# Patient Record
Sex: Male | Born: 1935 | Race: White | Hispanic: No | Smoking: Former smoker
Health system: Southern US, Community
[De-identification: ages and names within clinical notes are randomized; demographics above are authoritative.]

## PROBLEM LIST (undated history)

## (undated) DIAGNOSIS — I2510 Atherosclerotic heart disease of native coronary artery without angina pectoris: Secondary | ICD-10-CM

## (undated) DIAGNOSIS — E7800 Pure hypercholesterolemia, unspecified: Secondary | ICD-10-CM

## (undated) DIAGNOSIS — I10 Essential (primary) hypertension: Secondary | ICD-10-CM

## (undated) DIAGNOSIS — E119 Type 2 diabetes mellitus without complications: Secondary | ICD-10-CM

## (undated) DIAGNOSIS — I639 Cerebral infarction, unspecified: Secondary | ICD-10-CM

## (undated) DIAGNOSIS — G459 Transient cerebral ischemic attack, unspecified: Secondary | ICD-10-CM

## (undated) DIAGNOSIS — I219 Acute myocardial infarction, unspecified: Secondary | ICD-10-CM

## (undated) HISTORY — PX: CAROTID ENDARTERECTOMY: SUR193

## (undated) HISTORY — PX: HX TONSILLECTOMY: SHX27

## (undated) HISTORY — PX: HX WRIST FRACTURE TX: SHX121

## (undated) HISTORY — DX: Atherosclerotic heart disease of native coronary artery without angina pectoris: I25.10

## (undated) HISTORY — PX: HX HEART SURGERY: 2100001149

## (undated) HISTORY — DX: Cerebral infarction, unspecified (CMS HCC): I63.9

## (undated) HISTORY — PX: HX CAROTID ENDARTERECTOMY: SHX38

## (undated) HISTORY — DX: Acute myocardial infarction, unspecified (CMS HCC): I21.9

## (undated) HISTORY — PX: HX CORONARY ARTERY BYPASS GRAFT: SHX141

## (undated) HISTORY — PX: HX APPENDECTOMY: SHX54

## (undated) HISTORY — PX: HX ANKLE FRACTURE TX: SHX122

---

## 1958-12-02 HISTORY — PX: LUMBAR DISC SURGERY: SHX700

## 1982-12-02 HISTORY — PX: WRIST SURGERY: SHX841

## 1994-09-19 ENCOUNTER — Ambulatory Visit (HOSPITAL_COMMUNITY): Payer: Self-pay

## 1994-12-02 HISTORY — PX: ANKLE SURGERY: SHX546

## 2005-11-25 ENCOUNTER — Inpatient Hospital Stay (HOSPITAL_COMMUNITY): Admission: EM | Admit: 2005-11-25 | Discharge: 2005-11-28 | Payer: Self-pay | Admitting: Emergency Medicine

## 2005-11-28 ENCOUNTER — Inpatient Hospital Stay: Admission: RE | Admit: 2005-11-28 | Discharge: 2005-12-04 | Payer: Self-pay | Admitting: Internal Medicine

## 2012-05-02 HISTORY — PX: CORONARY ARTERY BYPASS GRAFT: SHX141

## 2012-05-05 ENCOUNTER — Emergency Department (EMERGENCY_DEPARTMENT_HOSPITAL): Payer: Medicare Other

## 2012-05-05 ENCOUNTER — Emergency Department (EMERGENCY_DEPARTMENT_HOSPITAL)
Admission: EM | Admit: 2012-05-05 | Discharge: 2012-05-05 | Payer: Medicare Other | Attending: EMERGENCY MEDICINE | Admitting: EMERGENCY MEDICINE

## 2012-07-15 ENCOUNTER — Other Ambulatory Visit: Payer: Self-pay | Admitting: Family Medicine

## 2012-07-15 DIAGNOSIS — I679 Cerebrovascular disease, unspecified: Secondary | ICD-10-CM

## 2012-07-16 ENCOUNTER — Other Ambulatory Visit: Payer: Self-pay

## 2012-07-18 ENCOUNTER — Ambulatory Visit
Admission: RE | Admit: 2012-07-18 | Discharge: 2012-07-18 | Disposition: A | Discharge status: Home / Self Care | Payer: Medicare PPO | Source: Ambulatory Visit | Attending: Family Medicine | Admitting: Family Medicine

## 2012-07-18 DIAGNOSIS — I679 Cerebrovascular disease, unspecified: Secondary | ICD-10-CM

## 2012-07-22 ENCOUNTER — Ambulatory Visit: Payer: Medicare PPO | Admitting: *Deleted

## 2012-07-22 ENCOUNTER — Ambulatory Visit: Payer: Medicare PPO | Attending: Family Medicine | Admitting: Physical Therapy

## 2012-07-22 DIAGNOSIS — Z5189 Encounter for other specified aftercare: Secondary | ICD-10-CM | POA: Insufficient documentation

## 2012-07-22 DIAGNOSIS — I69919 Unspecified symptoms and signs involving cognitive functions following unspecified cerebrovascular disease: Secondary | ICD-10-CM | POA: Insufficient documentation

## 2012-07-22 DIAGNOSIS — R5381 Other malaise: Secondary | ICD-10-CM | POA: Insufficient documentation

## 2012-07-22 DIAGNOSIS — M2560 Stiffness of unspecified joint, not elsewhere classified: Secondary | ICD-10-CM | POA: Insufficient documentation

## 2012-07-22 DIAGNOSIS — M6281 Muscle weakness (generalized): Secondary | ICD-10-CM | POA: Insufficient documentation

## 2012-07-22 DIAGNOSIS — R269 Unspecified abnormalities of gait and mobility: Secondary | ICD-10-CM | POA: Insufficient documentation

## 2012-07-22 DIAGNOSIS — I69998 Other sequelae following unspecified cerebrovascular disease: Secondary | ICD-10-CM | POA: Insufficient documentation

## 2012-07-22 DIAGNOSIS — R279 Unspecified lack of coordination: Secondary | ICD-10-CM | POA: Insufficient documentation

## 2012-07-27 ENCOUNTER — Ambulatory Visit: Payer: Medicare PPO

## 2012-07-29 ENCOUNTER — Ambulatory Visit: Payer: Medicare PPO | Admitting: Occupational Therapy

## 2012-07-29 ENCOUNTER — Ambulatory Visit: Payer: Medicare PPO | Admitting: Physical Therapy

## 2012-07-30 ENCOUNTER — Ambulatory Visit: Payer: Medicare PPO | Admitting: Occupational Therapy

## 2012-07-31 ENCOUNTER — Ambulatory Visit: Payer: Medicare PPO | Admitting: Rehabilitative and Restorative Service Providers"

## 2012-08-04 ENCOUNTER — Ambulatory Visit: Payer: Medicare PPO | Attending: Family Medicine | Admitting: Rehabilitative and Restorative Service Providers"

## 2012-08-04 DIAGNOSIS — M2560 Stiffness of unspecified joint, not elsewhere classified: Secondary | ICD-10-CM | POA: Insufficient documentation

## 2012-08-04 DIAGNOSIS — I69998 Other sequelae following unspecified cerebrovascular disease: Secondary | ICD-10-CM | POA: Insufficient documentation

## 2012-08-04 DIAGNOSIS — R5381 Other malaise: Secondary | ICD-10-CM | POA: Insufficient documentation

## 2012-08-04 DIAGNOSIS — M6281 Muscle weakness (generalized): Secondary | ICD-10-CM | POA: Insufficient documentation

## 2012-08-04 DIAGNOSIS — I69919 Unspecified symptoms and signs involving cognitive functions following unspecified cerebrovascular disease: Secondary | ICD-10-CM | POA: Insufficient documentation

## 2012-08-04 DIAGNOSIS — R269 Unspecified abnormalities of gait and mobility: Secondary | ICD-10-CM | POA: Insufficient documentation

## 2012-08-04 DIAGNOSIS — Z5189 Encounter for other specified aftercare: Secondary | ICD-10-CM | POA: Insufficient documentation

## 2012-08-04 DIAGNOSIS — R279 Unspecified lack of coordination: Secondary | ICD-10-CM | POA: Insufficient documentation

## 2012-08-05 ENCOUNTER — Ambulatory Visit: Payer: Medicare PPO | Admitting: *Deleted

## 2012-08-05 ENCOUNTER — Ambulatory Visit: Payer: Medicare PPO | Admitting: Occupational Therapy

## 2012-08-07 ENCOUNTER — Ambulatory Visit: Payer: Medicare PPO

## 2012-08-07 ENCOUNTER — Ambulatory Visit: Payer: Medicare PPO | Admitting: Physical Therapy

## 2012-08-11 ENCOUNTER — Ambulatory Visit: Payer: Medicare PPO | Admitting: Occupational Therapy

## 2012-08-11 ENCOUNTER — Ambulatory Visit: Payer: Medicare PPO

## 2012-08-11 ENCOUNTER — Ambulatory Visit: Payer: Medicare PPO | Admitting: *Deleted

## 2012-08-14 ENCOUNTER — Ambulatory Visit: Payer: Medicare PPO | Admitting: Physical Therapy

## 2012-08-14 ENCOUNTER — Ambulatory Visit: Payer: Medicare PPO | Admitting: *Deleted

## 2012-08-14 ENCOUNTER — Ambulatory Visit: Payer: Medicare PPO

## 2012-08-18 ENCOUNTER — Ambulatory Visit: Payer: Medicare PPO | Admitting: Speech Pathology

## 2012-08-18 ENCOUNTER — Ambulatory Visit: Payer: Medicare PPO | Admitting: Occupational Therapy

## 2012-08-18 ENCOUNTER — Ambulatory Visit: Payer: Medicare PPO | Admitting: Physical Therapy

## 2012-08-20 ENCOUNTER — Ambulatory Visit: Payer: Medicare PPO | Admitting: Physical Therapy

## 2012-08-20 ENCOUNTER — Ambulatory Visit: Payer: Medicare PPO | Admitting: Occupational Therapy

## 2012-08-20 ENCOUNTER — Ambulatory Visit: Payer: Medicare PPO | Admitting: *Deleted

## 2012-08-26 ENCOUNTER — Ambulatory Visit: Payer: Medicare PPO | Admitting: Occupational Therapy

## 2012-08-26 ENCOUNTER — Ambulatory Visit: Payer: Medicare PPO

## 2012-08-26 ENCOUNTER — Ambulatory Visit: Payer: Medicare PPO | Admitting: *Deleted

## 2012-08-27 ENCOUNTER — Ambulatory Visit: Payer: Medicare PPO | Admitting: Physical Therapy

## 2012-08-27 ENCOUNTER — Ambulatory Visit: Payer: Medicare PPO | Admitting: Occupational Therapy

## 2012-08-27 ENCOUNTER — Ambulatory Visit: Payer: Medicare PPO

## 2012-08-28 ENCOUNTER — Encounter: Payer: Medicare PPO | Admitting: *Deleted

## 2012-08-31 ENCOUNTER — Encounter: Payer: Medicare PPO | Admitting: Occupational Therapy

## 2012-08-31 ENCOUNTER — Ambulatory Visit: Payer: Medicare PPO | Admitting: Physical Therapy

## 2012-08-31 ENCOUNTER — Ambulatory Visit: Payer: Medicare PPO | Admitting: Speech Pathology

## 2012-09-03 ENCOUNTER — Ambulatory Visit: Payer: Medicare PPO

## 2012-09-03 ENCOUNTER — Ambulatory Visit: Payer: Medicare PPO | Attending: Family Medicine | Admitting: Physical Therapy

## 2012-09-03 ENCOUNTER — Ambulatory Visit: Payer: Medicare PPO | Admitting: Occupational Therapy

## 2012-09-03 DIAGNOSIS — M2560 Stiffness of unspecified joint, not elsewhere classified: Secondary | ICD-10-CM | POA: Insufficient documentation

## 2012-09-03 DIAGNOSIS — I69998 Other sequelae following unspecified cerebrovascular disease: Secondary | ICD-10-CM | POA: Insufficient documentation

## 2012-09-03 DIAGNOSIS — Z5189 Encounter for other specified aftercare: Secondary | ICD-10-CM | POA: Insufficient documentation

## 2012-09-03 DIAGNOSIS — R279 Unspecified lack of coordination: Secondary | ICD-10-CM | POA: Insufficient documentation

## 2012-09-03 DIAGNOSIS — I69919 Unspecified symptoms and signs involving cognitive functions following unspecified cerebrovascular disease: Secondary | ICD-10-CM | POA: Insufficient documentation

## 2012-09-03 DIAGNOSIS — M6281 Muscle weakness (generalized): Secondary | ICD-10-CM | POA: Insufficient documentation

## 2012-09-03 DIAGNOSIS — R269 Unspecified abnormalities of gait and mobility: Secondary | ICD-10-CM | POA: Insufficient documentation

## 2012-09-03 DIAGNOSIS — R5381 Other malaise: Secondary | ICD-10-CM | POA: Insufficient documentation

## 2012-09-04 ENCOUNTER — Ambulatory Visit: Payer: Medicare PPO | Admitting: *Deleted

## 2012-09-07 ENCOUNTER — Ambulatory Visit: Payer: Medicare PPO | Admitting: Physical Therapy

## 2012-09-07 ENCOUNTER — Encounter: Payer: Medicare PPO | Admitting: Occupational Therapy

## 2012-09-08 ENCOUNTER — Ambulatory Visit: Payer: Medicare PPO

## 2012-09-08 ENCOUNTER — Ambulatory Visit: Payer: Medicare PPO | Admitting: Occupational Therapy

## 2012-09-08 ENCOUNTER — Encounter: Payer: Medicare PPO | Admitting: Occupational Therapy

## 2012-09-08 ENCOUNTER — Ambulatory Visit: Payer: Medicare PPO | Admitting: Physical Therapy

## 2012-09-11 ENCOUNTER — Ambulatory Visit: Payer: Medicare PPO | Admitting: *Deleted

## 2012-09-11 ENCOUNTER — Ambulatory Visit: Payer: Medicare PPO | Admitting: Occupational Therapy

## 2012-09-11 ENCOUNTER — Ambulatory Visit: Payer: Medicare PPO

## 2012-09-12 ENCOUNTER — Emergency Department (HOSPITAL_COMMUNITY): Payer: Medicare PPO

## 2012-09-12 ENCOUNTER — Encounter (HOSPITAL_COMMUNITY): Payer: Self-pay | Admitting: Family Medicine

## 2012-09-12 ENCOUNTER — Inpatient Hospital Stay (HOSPITAL_COMMUNITY)
Admission: EM | Admit: 2012-09-12 | Discharge: 2012-09-14 | DRG: 312 | Disposition: A | Discharge status: Home / Self Care | Payer: Medicare PPO | Attending: Internal Medicine | Admitting: Internal Medicine

## 2012-09-12 DIAGNOSIS — I639 Cerebral infarction, unspecified: Secondary | ICD-10-CM

## 2012-09-12 DIAGNOSIS — Z87891 Personal history of nicotine dependence: Secondary | ICD-10-CM

## 2012-09-12 DIAGNOSIS — E860 Dehydration: Secondary | ICD-10-CM | POA: Diagnosis present

## 2012-09-12 DIAGNOSIS — D649 Anemia, unspecified: Secondary | ICD-10-CM | POA: Diagnosis present

## 2012-09-12 DIAGNOSIS — E1169 Type 2 diabetes mellitus with other specified complication: Secondary | ICD-10-CM | POA: Diagnosis present

## 2012-09-12 DIAGNOSIS — E119 Type 2 diabetes mellitus without complications: Secondary | ICD-10-CM | POA: Diagnosis present

## 2012-09-12 DIAGNOSIS — E7800 Pure hypercholesterolemia, unspecified: Secondary | ICD-10-CM | POA: Diagnosis present

## 2012-09-12 DIAGNOSIS — R55 Syncope and collapse: Principal | ICD-10-CM | POA: Diagnosis present

## 2012-09-12 DIAGNOSIS — Z79899 Other long term (current) drug therapy: Secondary | ICD-10-CM

## 2012-09-12 DIAGNOSIS — Z8673 Personal history of transient ischemic attack (TIA), and cerebral infarction without residual deficits: Secondary | ICD-10-CM

## 2012-09-12 DIAGNOSIS — I059 Rheumatic mitral valve disease, unspecified: Secondary | ICD-10-CM | POA: Diagnosis present

## 2012-09-12 DIAGNOSIS — I10 Essential (primary) hypertension: Secondary | ICD-10-CM | POA: Diagnosis present

## 2012-09-12 DIAGNOSIS — E876 Hypokalemia: Secondary | ICD-10-CM | POA: Diagnosis not present

## 2012-09-12 DIAGNOSIS — I672 Cerebral atherosclerosis: Secondary | ICD-10-CM | POA: Diagnosis present

## 2012-09-12 DIAGNOSIS — I6350 Cerebral infarction due to unspecified occlusion or stenosis of unspecified cerebral artery: Secondary | ICD-10-CM

## 2012-09-12 DIAGNOSIS — Z794 Long term (current) use of insulin: Secondary | ICD-10-CM

## 2012-09-12 DIAGNOSIS — Z951 Presence of aortocoronary bypass graft: Secondary | ICD-10-CM

## 2012-09-12 DIAGNOSIS — I2510 Atherosclerotic heart disease of native coronary artery without angina pectoris: Secondary | ICD-10-CM | POA: Diagnosis present

## 2012-09-12 DIAGNOSIS — I6789 Other cerebrovascular disease: Secondary | ICD-10-CM

## 2012-09-12 DIAGNOSIS — Z7982 Long term (current) use of aspirin: Secondary | ICD-10-CM

## 2012-09-12 DIAGNOSIS — I511 Rupture of chordae tendineae, not elsewhere classified: Secondary | ICD-10-CM

## 2012-09-12 DIAGNOSIS — I671 Cerebral aneurysm, nonruptured: Secondary | ICD-10-CM | POA: Diagnosis present

## 2012-09-12 DIAGNOSIS — I340 Nonrheumatic mitral (valve) insufficiency: Secondary | ICD-10-CM

## 2012-09-12 HISTORY — DX: Atherosclerotic heart disease of native coronary artery without angina pectoris: I25.10

## 2012-09-12 HISTORY — DX: Type 2 diabetes mellitus without complications: E11.9

## 2012-09-12 HISTORY — DX: Transient cerebral ischemic attack, unspecified: G45.9

## 2012-09-12 HISTORY — DX: Cerebral infarction, unspecified: I63.9

## 2012-09-12 HISTORY — DX: Pure hypercholesterolemia, unspecified: E78.00

## 2012-09-12 HISTORY — DX: Essential (primary) hypertension: I10

## 2012-09-12 LAB — CBC
HCT: 33.6 % — ABNORMAL LOW (ref 39.0–52.0)
MCH: 31.8 pg (ref 26.0–34.0)
MCV: 97.1 fL (ref 78.0–100.0)
Platelets: 248 10*3/uL (ref 150–400)
RDW: 13.8 % (ref 11.5–15.5)
WBC: 9 10*3/uL (ref 4.0–10.5)

## 2012-09-12 LAB — BASIC METABOLIC PANEL
CO2: 28 mEq/L (ref 19–32)
Chloride: 107 mEq/L (ref 96–112)
Creatinine, Ser: 1.28 mg/dL (ref 0.50–1.35)
GFR calc Af Amer: 57 mL/min — ABNORMAL LOW (ref >90)
Potassium: 5.4 mEq/L — ABNORMAL HIGH (ref 3.5–5.1)
Sodium: 140 mEq/L (ref 135–145)

## 2012-09-12 LAB — CBC WITH DIFFERENTIAL/PLATELET
Basophils Absolute: 0.1 10*3/uL (ref 0.0–0.1)
Eosinophils Relative: 5 % (ref 0–5)
HCT: 34.2 % — ABNORMAL LOW (ref 39.0–52.0)
Hemoglobin: 11.1 g/dL — ABNORMAL LOW (ref 13.0–17.0)
Lymphocytes Relative: 18 % (ref 12–46)
Lymphs Abs: 1.3 10*3/uL (ref 0.7–4.0)
MCV: 96.3 fL (ref 78.0–100.0)
Monocytes Absolute: 0.4 10*3/uL (ref 0.1–1.0)
Neutro Abs: 5 10*3/uL (ref 1.7–7.7)
RBC: 3.55 MIL/uL — ABNORMAL LOW (ref 4.22–5.81)
RDW: 13.6 % (ref 11.5–15.5)
WBC: 7.2 10*3/uL (ref 4.0–10.5)

## 2012-09-12 LAB — URINALYSIS, ROUTINE W REFLEX MICROSCOPIC
Hgb urine dipstick: NEGATIVE
Nitrite: NEGATIVE
Specific Gravity, Urine: 1.016 (ref 1.005–1.030)
Urobilinogen, UA: 0.2 mg/dL (ref 0.0–1.0)
pH: 7.5 (ref 5.0–8.0)

## 2012-09-12 LAB — GLUCOSE, CAPILLARY
Glucose-Capillary: 142 mg/dL — ABNORMAL HIGH (ref 70–99)
Glucose-Capillary: 226 mg/dL — ABNORMAL HIGH (ref 70–99)
Glucose-Capillary: 54 mg/dL — ABNORMAL LOW (ref 70–99)

## 2012-09-12 LAB — CREATININE, SERUM: GFR calc Af Amer: 53 mL/min — ABNORMAL LOW (ref >90)

## 2012-09-12 MED ORDER — NITROGLYCERIN 0.4 MG SL SUBL: 0.4000 mg | SUBLINGUAL_TABLET | SUBLINGUAL | Status: DC | PRN

## 2012-09-12 MED ORDER — ACETAMINOPHEN 325 MG PO TABS
650.0000 mg | ORAL_TABLET | ORAL | Status: DC | PRN
  Administered 2012-09-13 (×2): 650 mg via ORAL
  Filled 2012-09-12 (×2): qty 2

## 2012-09-12 MED ORDER — SODIUM CHLORIDE 0.9 % IV SOLN: INTRAVENOUS | Status: DC

## 2012-09-12 MED ORDER — INSULIN ASPART 100 UNIT/ML ~~LOC~~ SOLN: 0.0000 [IU] | Freq: Every day | SUBCUTANEOUS | Status: DC

## 2012-09-12 MED ORDER — POLYETHYLENE GLYCOL 3350 17 G PO PACK
17.0000 g | PACK | Freq: Every day | ORAL | Status: DC | PRN
  Filled 2012-09-12: qty 1

## 2012-09-12 MED ORDER — ASPIRIN EC 81 MG PO TBEC
81.0000 mg | DELAYED_RELEASE_TABLET | Freq: Every day | ORAL | Status: DC
  Administered 2012-09-13: 81 mg via ORAL
  Filled 2012-09-12: qty 1

## 2012-09-12 MED ORDER — IRBESARTAN 75 MG PO TABS
75.0000 mg | ORAL_TABLET | Freq: Every day | ORAL | Status: DC
  Administered 2012-09-13: 75 mg via ORAL
  Filled 2012-09-12 (×3): qty 1

## 2012-09-12 MED ORDER — DOCUSATE SODIUM 100 MG PO CAPS
100.0000 mg | ORAL_CAPSULE | Freq: Two times a day (BID) | ORAL | Status: DC
  Administered 2012-09-12 – 2012-09-14 (×4): 100 mg via ORAL
  Filled 2012-09-12 (×5): qty 1

## 2012-09-12 MED ORDER — METOPROLOL SUCCINATE ER 50 MG PO TB24
50.0000 mg | ORAL_TABLET | Freq: Every day | ORAL | Status: DC
  Administered 2012-09-13 – 2012-09-14 (×2): 50 mg via ORAL
  Filled 2012-09-12 (×2): qty 1

## 2012-09-12 MED ORDER — FERROUS SULFATE 325 (65 FE) MG PO TABS
325.0000 mg | ORAL_TABLET | Freq: Every day | ORAL | Status: DC
  Administered 2012-09-13 – 2012-09-14 (×2): 325 mg via ORAL
  Filled 2012-09-12 (×3): qty 1

## 2012-09-12 MED ORDER — ASPIRIN 81 MG PO TABS: 81.0000 mg | ORAL_TABLET | Freq: Every day | ORAL | Status: DC

## 2012-09-12 MED ORDER — SODIUM CHLORIDE 0.9 % IJ SOLN: 3.0000 mL | INTRAMUSCULAR | Status: DC | PRN

## 2012-09-12 MED ORDER — INSULIN ASPART 100 UNIT/ML ~~LOC~~ SOLN
0.0000 [IU] | Freq: Three times a day (TID) | SUBCUTANEOUS | Status: DC
  Administered 2012-09-12: 3 [IU] via SUBCUTANEOUS

## 2012-09-12 MED ORDER — INSULIN ASPART 100 UNIT/ML ~~LOC~~ SOLN
3.0000 [IU] | Freq: Three times a day (TID) | SUBCUTANEOUS | Status: DC
  Administered 2012-09-12: 3 [IU] via SUBCUTANEOUS

## 2012-09-12 MED ORDER — ONDANSETRON HCL 4 MG/2ML IJ SOLN: 4.0000 mg | Freq: Four times a day (QID) | INTRAMUSCULAR | Status: DC | PRN

## 2012-09-12 MED ORDER — SODIUM CHLORIDE 0.9 % IJ SOLN: 3.0000 mL | Freq: Two times a day (BID) | INTRAMUSCULAR | Status: DC

## 2012-09-12 MED ORDER — SODIUM CHLORIDE 0.9 % IV SOLN: 250.0000 mL | INTRAVENOUS | Status: DC | PRN

## 2012-09-12 MED ORDER — ONDANSETRON HCL 4 MG PO TABS: 4.0000 mg | ORAL_TABLET | Freq: Four times a day (QID) | ORAL | Status: DC | PRN

## 2012-09-12 MED ORDER — DOCUSATE SODIUM 100 MG PO CAPS: 100.0000 mg | ORAL_CAPSULE | Freq: Two times a day (BID) | ORAL | Status: DC

## 2012-09-12 MED ORDER — HEPARIN SODIUM (PORCINE) 5000 UNIT/ML IJ SOLN
5000.0000 [IU] | Freq: Three times a day (TID) | INTRAMUSCULAR | Status: DC
  Administered 2012-09-12 – 2012-09-14 (×4): 5000 [IU] via SUBCUTANEOUS
  Filled 2012-09-12 (×8): qty 1

## 2012-09-12 MED ORDER — INSULIN GLARGINE 100 UNIT/ML ~~LOC~~ SOLN: 10.0000 [IU] | Freq: Every day | SUBCUTANEOUS | Status: DC

## 2012-09-12 MED ORDER — ACETAMINOPHEN ER 650 MG PO TBCR: 650.0000 mg | EXTENDED_RELEASE_TABLET | ORAL | Status: DC | PRN

## 2012-09-12 MED ORDER — HEPARIN SODIUM (PORCINE) 5000 UNIT/ML IJ SOLN: 5000.0000 [IU] | Freq: Three times a day (TID) | INTRAMUSCULAR | Status: DC

## 2012-09-12 MED ORDER — SODIUM CHLORIDE 0.9 % IJ SOLN
3.0000 mL | Freq: Two times a day (BID) | INTRAMUSCULAR | Status: DC
  Administered 2012-09-13: 3 mL via INTRAVENOUS

## 2012-09-12 MED ORDER — SENNA 8.6 MG PO TABS
1.0000 | ORAL_TABLET | Freq: Two times a day (BID) | ORAL | Status: DC
  Administered 2012-09-12 – 2012-09-14 (×4): 8.6 mg via ORAL
  Filled 2012-09-12 (×5): qty 1

## 2012-09-12 MED ORDER — ADULT MULTIVITAMIN W/MINERALS CH
1.0000 | ORAL_TABLET | Freq: Every day | ORAL | Status: DC
  Administered 2012-09-13 – 2012-09-14 (×2): 1 via ORAL
  Filled 2012-09-12 (×2): qty 1

## 2012-09-12 MED ORDER — ATORVASTATIN CALCIUM 40 MG PO TABS
40.0000 mg | ORAL_TABLET | Freq: Every day | ORAL | Status: DC
  Administered 2012-09-13: 40 mg via ORAL
  Filled 2012-09-12 (×2): qty 1

## 2012-09-12 NOTE — H&P (Signed)
[facility] History and Physical  DETERRIUS May AVW:[id number] DOB: [birth year] DOA: 09/12/2012  Referring physician: Dr. Verne Spurr PCP: Benita Stabile, MD     Chief Complaint: Syncope  HPI: Douglas May is a 78 y.o. male  There is an 78 year old male with past medical history of diabetes type 2, history of CVA in June 2013, hypertension that comes in as he passed out this morning. As per daughter which is at bedside and giving the history he came down from his room, started to make his breakfast when he said he didn't feel well and sat down. Lost consciousness for about 10 minutes and 6 EMS got there he was awake and making jokes. She did not see any seizure-like activity. The patient denies any chest pain shortness of breath palpitations. He just felt tired before this episode. His blood glucose was checked in the morning before he passed out by his daughter and it was 22 .He relates no prodromal symptoms. He's had no fever chills nausea vomiting or diarrhea.  Review of Systems: The patient denies anorexia, fever, weight loss,, vision loss, decreased hearing, hoarseness, chest pain, syncope, dyspnea on exertion, peripheral edema, balance deficits, hemoptysis, abdominal pain, melena, hematochezia, severe indigestion/heartburn, hematuria, incontinence, genital sores, muscle weakness, suspicious skin lesions, transient blindness, difficulty walking, depression, unusual weight change, abnormal bleeding, enlarged lymph nodes, angioedema, and breast masses.   Past Medical History  Diagnosis Date  . Stroke   . Diabetes mellitus without complication   . CAD (coronary artery disease)   . Hypertension   . High cholesterol   . TIA (transient ischemic attack)    Past Surgical History  Procedure Date  . Carotid endarterectomy   . Coronary artery bypass graft    Social History:  reports that he has quit smoking. His smoking use included Cigarettes. He smoked 1 pack per day. He does  not have any smokeless tobacco history on file. He reports that he does not drink alcohol or use illicit drugs. Lives with daughter, currently getting outpatient physical therapy. Performed all his ADLs Allergies  Allergen Reactions  . Morphine And Related Itching  . Penicillins Other (See Comments)    unknown    No family history on file. parents died unknown cause (be sure to complete)  Prior to Admission medications   Medication Sig Start Date End Date Taking? Authorizing Provider  acetaminophen (TYLENOL) 650 MG CR tablet Take 650 mg by mouth every 4 (four) hours as needed. For pain   Yes Historical Provider, MD  aspirin 81 MG tablet Take 81 mg by mouth daily.   Yes Historical Provider, MD  atorvastatin (LIPITOR) 40 MG tablet Take 40 mg by mouth daily.   Yes Historical Provider, MD  CALCIUM-VITAMIN D PO Take 1 tablet by mouth daily.   Yes Historical Provider, MD  docusate sodium (COLACE) 100 MG capsule Take 100 mg by mouth 2 (two) times daily.   Yes Historical Provider, MD  ferrous sulfate 325 (65 FE) MG tablet Take 325 mg by mouth daily with breakfast.   Yes Historical Provider, MD  insulin glargine (LANTUS) 100 UNIT/ML injection Inject 10 Units into the skin at bedtime.   Yes Historical Provider, MD  irbesartan (AVAPRO) 75 MG tablet Take 75 mg by mouth at bedtime.   Yes Historical Provider, MD  metoprolol succinate (TOPROL-XL) 50 MG 24 hr tablet Take 50 mg by mouth daily. Take with or immediately following a meal.   Yes Historical Provider, MD  Multiple Vitamin (MULTIVITAMIN WITH  MINERALS) TABS Take 1 tablet by mouth daily.   Yes Historical Provider, MD  Multiple Vitamins-Minerals (PRESERVISION AREDS PO) Take 1 capsule by mouth 2 (two) times daily.   Yes Historical Provider, MD  naproxen (NAPROSYN) 500 MG tablet Take 500 mg by mouth daily as needed.   Yes Historical Provider, MD  nitroGLYCERIN (NITROSTAT) 0.4 MG SL tablet Place 0.4 mg under the tongue every 5 (five) minutes as needed.  Chest pain   Yes Historical Provider, MD  polyethylene glycol (MIRALAX / GLYCOLAX) packet Take 17 g by mouth daily as needed. constipation   Yes Historical Provider, MD   Physical Exam: Filed Vitals:   09/12/12 1247 09/12/12 1248 09/12/12 1250 09/12/12 1300  BP: 133/59 135/60 127/61 129/61  Pulse: 64 82 80 65  Temp:      TempSrc:      Resp:    18  Weight:      SpO2:    100%     General:  Awake alert and oriented x3 slightly dysarthric  Eyes: Anicteric pupils equally round and reactive trunk movements are intact  ENT: Moist mucous member, no bruit  Neck: No JVD  Cardiovascular: Regular rate and rhythm  Respiratory: Good air movement clear to auscultation  Abdomen: Positive bowel sounds nontender nondistended and soft  Skin: Multiple bruises  Musculoskeletal: Intact, except for deformed left shoulder  Psychiatric: Appropriate  Neurologic: Awake alert and oriented x3 coherent for language mildly dysarthric. Cranial nerves 3-12 are grossly intact sensation is intact throughout muscle strength is 5 over 5 on the left and about a 4/5 on the right from his old stroke. Reflexes are intact and finger-to-nose is normal  Labs on Admission:  Basic Metabolic Panel:  Lab 09/12/12 1610  NA 140  K 5.4*  CL 107  CO2 28  GLUCOSE 132*  BUN 26*  CREATININE 1.28  CALCIUM 9.8  MG --  PHOS --   Liver Function Tests: No results found for this basename: AST:5,ALT:5,ALKPHOS:5,BILITOT:5,PROT:5,ALBUMIN:5 in the last 168 hours No results found for this basename: LIPASE:5,AMYLASE:5 in the last 168 hours No results found for this basename: AMMONIA:5 in the last 168 hours CBC:  Lab 09/12/12 1054  WBC 7.2  NEUTROABS 5.0  HGB 11.1*  HCT 34.2*  MCV 96.3  PLT 253   Cardiac Enzymes:  Lab 09/12/12 1055  CKTOTAL --  CKMB --  CKMBINDEX --  TROPONINI <0.30    BNP (last 3 results) No results found for this basename: PROBNP:3 in the last 8760 hours CBG:  Lab 09/12/12 1021    GLUCAP 142*    Radiological Exams on Admission: Dg Chest 2 View  09/12/2012  *RADIOLOGY REPORT*  Clinical Data: Weakness and syncope  CHEST - 2 VIEW  Comparison: 11/25/2005  Findings: Postsurgical changes are again seen.  The heart and pulmonary vascularity are within normal limits.  A hiatal hernia is noted.  The lungs are clear bilaterally.  Chronic changes in the proximal left humerus are noted.  IMPRESSION: No acute abnormality is seen.   Original Report Authenticated By: Phillips Odor, M.D.    Ct Head Wo Contrast  09/12/2012  *RADIOLOGY REPORT*  Clinical Data: Sudden onset weakness and syncope, no trauma  CT HEAD WITHOUT CONTRAST  Technique:  Contiguous axial images were obtained from the base of the skull through the vertex without contrast.  Comparison: MR brain 07/18/2012  Findings: Calvarium is intact.  No hemorrhage, infarct, or mass. Diffuse cortical atrophy.  Severe periventricular white matter low attenuation consistent  with chronic small vessel ischemia with encephalomalacia left occipital lobe.  IMPRESSION: No acute findings.  Severe chronic age related involutional change.   Original Report Authenticated By: Otilio Carpen, M.D.     EKG: Independently reviewed. Shows normal sinus rhythm  Assessment/Plan Principal Problem: Syncope and collapse: -Carotid Doppler was done on 6 2013 this showed a 50% occlusion bilaterally on internal carotids. Will admit to telemetry, we'll cycle his cardiac enzymes. We'll get an MRI of the head to rule out a stroke. We'll place him n.p.o. until he passed a swallowing evaluation, then  Resumed a carb modified diet. We'll get a 2-D echo to rule out aortic stenosis. We'll get a physical therapy consult. Patient can get up with assistance. -Continue aspirin, statins and insulin.  HTN (hypertension): -Continue home meds currently well controlled.  Diabetes mellitus type 2, controlled -Continue current home dose of Lantus and sliding scale  insulin check a hemoglobin A1c check CBGs a.c. and at bedtime.   Hx of CABG/ CVA  05/2012 in [place] -Continue aspirin. -Beta blockers and statins.  Anemia: -Seems to be stable compared to previous. -Monitor.   Hypercholesteremia: -Continue statins.  Family Communication: Daughter Disposition Plan: Home (indicate anticipated LOS)  Time spent: 60 minutes  Marinda Elk [facility] Pager [phone]  If 7PM-7AM, please contact night-coverage [email or web address] Password [email or web address] 09/12/2012, 2:17 PM

## 2012-09-12 NOTE — ED Notes (Signed)
Per EMS, pt was sitting at kitchen table and had a syncopal episode. Pt lethargic upon arrival. CBG 132. BP 127/99. HR 62. Pt A&Ox3 currently. EKG nothing acute. Hx of CVA with right sided weakness and slurred speech.

## 2012-09-12 NOTE — ED Notes (Signed)
Patient transported to CT 

## 2012-09-12 NOTE — ED Provider Notes (Signed)
History     CSN: [id number]  Arrival date & time 09/12/12  1013   First MD Initiated Contact with Patient 09/12/12 1029      Chief Complaint  Patient presents with  . Loss of Consciousness     HPI Pt was seen at 1040.  Per pt and his family, c/o sudden onset and resolution of one episode of syncope that occurred PTA.  Pt was making breakfast, then walked over to a chair stating to his family he "didn't feel well."  This was followed by a syncopal episode that lasted approx 15 minutes per family witness.  Pt woke up and quickly returned to his baseline shortly after EMS arrival to scene.  Denies prodromal symptoms before syncope.  No seizure activity, no apnea, no incont of bowel or bladder, no AMS upon awakening. Denies CP/palpitations, no SOB/cough, no abd pain, no back pain, no N/V/D, no new focal motor weakness, no tingling/numbness in extremities.    Past Medical History  Diagnosis Date  . Stroke   . Diabetes mellitus without complication   . CAD (coronary artery disease)   . Hypertension   . High cholesterol   . TIA (transient ischemic attack)     Past Surgical History  Procedure Date  . Carotid endarterectomy   . Coronary artery bypass graft       History  Substance Use Topics  . Smoking status: Former smoker  . Smokeless tobacco: None  . Alcohol Use: none      Review of Systems ROS: Statement: All systems negative except as marked or noted in the HPI; Constitutional: Negative for fever and chills. ; ; Eyes: Negative for eye pain, redness and discharge. ; ; ENMT: Negative for ear pain, hoarseness, nasal congestion, sinus pressure and sore throat. ; ; Cardiovascular: Negative for chest pain, palpitations, diaphoresis, dyspnea and peripheral edema. ; ; Respiratory: Negative for cough, wheezing and stridor. ; ; Gastrointestinal: Negative for nausea, vomiting, diarrhea, abdominal pain, blood in stool, hematemesis, jaundice and rectal bleeding. . ; ; Genitourinary:  Negative for dysuria, flank pain and hematuria. ; ; Musculoskeletal: Negative for back pain and neck pain. Negative for swelling and trauma.; ; Skin: Negative for pruritus, rash, abrasions, blisters, bruising and skin lesion.; ; Neuro: Negative for headache, lightheadedness and neck stiffness. Negative for weakness, altered mental status, extremity weakness, paresthesias, involuntary movement, seizure and +syncope.       Allergies  Morphine and related and Penicillins  Home Medications   Current Outpatient Rx  Name Route Sig Dispense Refill  . ACETAMINOPHEN ER 650 MG PO TBCR Oral Take 650 mg by mouth every 4 (four) hours as needed. For pain    . ASPIRIN 81 MG PO TABS Oral Take 81 mg by mouth daily.    . ATORVASTATIN CALCIUM 40 MG PO TABS Oral Take 40 mg by mouth daily.    [name] CALCIUM-VITAMIN D PO Oral Take 1 tablet by mouth daily.    [name] DOCUSATE SODIUM 100 MG PO CAPS Oral Take 100 mg by mouth 2 (two) times daily.    [name] FERROUS SULFATE 325 (65 FE) MG PO TABS Oral Take 325 mg by mouth daily with breakfast.    . INSULIN GLARGINE 100 UNIT/ML [place] SOLN Subcutaneous Inject 10 Units into the skin at bedtime.    . IRBESARTAN 75 MG PO TABS Oral Take 75 mg by mouth at bedtime.    [name] METOPROLOL SUCCINATE ER 50 MG PO TB24 Oral Take 50 mg by mouth daily. Take  with or immediately following a meal.    . ADULT MULTIVITAMIN W/MINERALS CH Oral Take 1 tablet by mouth daily.    [name] PRESERVISION AREDS PO Oral Take 1 capsule by mouth 2 (two) times daily.    [name] NAPROXEN 500 MG PO TABS Oral Take 500 mg by mouth daily as needed.    [name] NITROGLYCERIN 0.4 MG SL SUBL Sublingual Place 0.4 mg under the tongue every 5 (five) minutes as needed. Chest pain    . POLYETHYLENE GLYCOL 3350 PO PACK Oral Take 17 g by mouth daily as needed. constipation      BP 129/61  Pulse 65  Temp 97.7 F (36.5 C) (Oral)  Resp 18  Wt 148 lb (67.132 kg)  SpO2 100%  Physical Exam 1045: Physical examination:  Nursing notes reviewed; Vital signs  and O2 SAT reviewed;  Constitutional: Well developed, Well nourished, In no acute distress; Head:  Normocephalic, atraumatic; Eyes: EOMI, PERRL, No scleral icterus, conjunctiva pale; ENMT: Mouth and pharynx normal, Mucous membranes dry; Neck: Supple, Full range of motion, No lymphadenopathy; Cardiovascular: Regular rate and rhythm, No gallop; Respiratory: Breath sounds clear & equal bilaterally, No wheezes.  Speaking full sentences with ease, Normal respiratory effort/excursion; Chest: Nontender, Movement normal; Abdomen: Soft, Nontender, Nondistended, Normal bowel sounds; Genitourinary: No CVA tenderness; Extremities: Pulses normal, No tenderness, No edema, No calf edema or asymmetry.; Neuro: AA&Ox3, Major CN grossly intact.  Speech slightly slurred per baseline per family at bedside. No facial droop.  Right grip weaker than left, RUE and RLE 4/5 strength compared to LUE and LLE, per hx of previous CVA.; Skin: Color pale, Warm, Dry.   ED Course  Procedures    MDM  MDM Reviewed: previous chart, nursing note and vitals Reviewed previous: ECG Interpretation: ECG, labs, x-ray and CT scan    Date: 09/12/2012  Rate: 60  Rhythm: normal sinus rhythm  QRS Axis: normal  Intervals: normal  ST/T Wave abnormalities: normal  Conduction Disutrbances:none  Narrative Interpretation:   Old EKG Reviewed: unchanged; no significant changes from previous EKG dated 11/25/2005.   Results for orders placed during the hospital encounter of 09/12/12  GLUCOSE, CAPILLARY      Component Value Range   Glucose-Capillary 142 (*) 70 - 99 mg/dL  BASIC METABOLIC PANEL      Component Value Range   Sodium 140  135 - 145 mEq/L   Potassium 5.4 (*) 3.5 - 5.1 mEq/L   Chloride 107  96 - 112 mEq/L   CO2 28  19 - 32 mEq/L   Glucose, Bld 132 (*) 70 - 99 mg/dL   BUN 26 (*) 6 - 23 mg/dL   Creatinine, Ser 7.82  0.50 - 1.35 mg/dL   Calcium 9.8  8.4 - 95.6 mg/dL   GFR calc non Af Amer 49 (*) >90 mL/min   GFR calc Af Amer 57  (*) >90 mL/min  CBC WITH DIFFERENTIAL      Component Value Range   WBC 7.2  4.0 - 10.5 K/uL   RBC 3.55 (*) 4.22 - 5.81 MIL/uL   Hemoglobin 11.1 (*) 13.0 - 17.0 g/dL   HCT 21.3 (*) 08.6 - 57.8 %   MCV 96.3  78.0 - 100.0 fL   MCH 31.3  26.0 - 34.0 pg   MCHC 32.5  30.0 - 36.0 g/dL   RDW 46.9  62.9 - 52.8 %   Platelets 253  150 - 400 K/uL   Neutrophils Relative 70  43 - 77 %   Neutro  Abs 5.0  1.7 - 7.7 K/uL   Lymphocytes Relative 18  12 - 46 %   Lymphs Abs 1.3  0.7 - 4.0 K/uL   Monocytes Relative 6  3 - 12 %   Monocytes Absolute 0.4  0.1 - 1.0 K/uL   Eosinophils Relative 5  0 - 5 %   Eosinophils Absolute 0.4  0.0 - 0.7 K/uL   Basophils Relative 1  0 - 1 %   Basophils Absolute 0.1  0.0 - 0.1 K/uL  TROPONIN I      Component Value Range   Troponin I <0.30  <0.30 ng/mL  URINALYSIS, ROUTINE W REFLEX MICROSCOPIC      Component Value Range   Color, Urine YELLOW  YELLOW   APPearance CLEAR  CLEAR   Specific Gravity, Urine 1.016  1.005 - 1.030   pH 7.5  5.0 - 8.0   Glucose, UA NEGATIVE  NEGATIVE mg/dL   Hgb urine dipstick NEGATIVE  NEGATIVE   Bilirubin Urine NEGATIVE  NEGATIVE   Ketones, ur NEGATIVE  NEGATIVE mg/dL   Protein, ur NEGATIVE  NEGATIVE mg/dL   Urobilinogen, UA 0.2  0.0 - 1.0 mg/dL   Nitrite NEGATIVE  NEGATIVE   Leukocytes, UA NEGATIVE  NEGATIVE   Dg Chest 2 View 09/12/2012  *RADIOLOGY REPORT*  Clinical Data: Weakness and syncope  CHEST - 2 VIEW  Comparison: 11/25/2005  Findings: Postsurgical changes are again seen.  The heart and pulmonary vascularity are within normal limits.  A hiatal hernia is noted.  The lungs are clear bilaterally.  Chronic changes in the proximal left humerus are noted.  IMPRESSION: No acute abnormality is seen.   Original Report Authenticated By: Phillips Odor, M.D.    Ct Head Wo Contrast 09/12/2012  *RADIOLOGY REPORT*  Clinical Data: Sudden onset weakness and syncope, no trauma  CT HEAD WITHOUT CONTRAST  Technique:  Contiguous axial images were  obtained from the base of the skull through the vertex without contrast.  Comparison: MR brain 07/18/2012  Findings: Calvarium is intact.  No hemorrhage, infarct, or mass. Diffuse cortical atrophy.  Severe periventricular white matter low attenuation consistent with chronic small vessel ischemia with encephalomalacia left occipital lobe.  IMPRESSION: No acute findings.  Severe chronic age related involutional change.   Original Report Authenticated By: Otilio Carpen, M.D.       1345:  No change in assessment.  Dx testing d/w pt and family.  Questions answered.  Verb understanding, agreeable to admit.  T/C to [facility]. David Stall, case discussed, including:  HPI, pertinent PM/SHx, VS/PE, dx testing, ED course and treatment:  Agreeable to admit, requests to write temporary orders, obtain tele bed to team 7.          Laray Anger, DO 09/12/12 2038

## 2012-09-13 ENCOUNTER — Inpatient Hospital Stay (HOSPITAL_COMMUNITY): Payer: Medicare PPO

## 2012-09-13 DIAGNOSIS — R55 Syncope and collapse: Secondary | ICD-10-CM

## 2012-09-13 DIAGNOSIS — D649 Anemia, unspecified: Secondary | ICD-10-CM

## 2012-09-13 DIAGNOSIS — I671 Cerebral aneurysm, nonruptured: Secondary | ICD-10-CM

## 2012-09-13 DIAGNOSIS — I6789 Other cerebrovascular disease: Secondary | ICD-10-CM

## 2012-09-13 DIAGNOSIS — I679 Cerebrovascular disease, unspecified: Secondary | ICD-10-CM

## 2012-09-13 LAB — URINE CULTURE

## 2012-09-13 LAB — COMPREHENSIVE METABOLIC PANEL
ALT: 10 U/L (ref 0–53)
AST: 15 U/L (ref 0–37)
Alkaline Phosphatase: 55 U/L (ref 39–117)
CO2: 27 mEq/L (ref 19–32)
Chloride: 108 mEq/L (ref 96–112)
Creatinine, Ser: 1.35 mg/dL (ref 0.50–1.35)
GFR calc non Af Amer: 46 mL/min — ABNORMAL LOW (ref >90)
Total Bilirubin: 0.3 mg/dL (ref 0.3–1.2)

## 2012-09-13 LAB — HEMOGLOBIN A1C
Hgb A1c MFr Bld: 6.1 % — ABNORMAL HIGH (ref <5.7)
Mean Plasma Glucose: 128 mg/dL — ABNORMAL HIGH (ref <117)

## 2012-09-13 LAB — RAPID URINE DRUG SCREEN, HOSP PERFORMED
Amphetamines: NOT DETECTED
Benzodiazepines: NOT DETECTED
Opiates: NOT DETECTED

## 2012-09-13 LAB — GLUCOSE, CAPILLARY
Glucose-Capillary: 130 mg/dL — ABNORMAL HIGH (ref 70–99)
Glucose-Capillary: 107 mg/dL — ABNORMAL HIGH (ref 70–99)
Glucose-Capillary: 140 mg/dL — ABNORMAL HIGH (ref 70–99)

## 2012-09-13 LAB — CBC
MCH: 31.2 pg (ref 26.0–34.0)
MCHC: 32.4 g/dL (ref 30.0–36.0)
Platelets: 221 10*3/uL (ref 150–400)
RBC: 3.33 MIL/uL — ABNORMAL LOW (ref 4.22–5.81)
RDW: 13.7 % (ref 11.5–15.5)

## 2012-09-13 LAB — LIPID PANEL
LDL Cholesterol: 40 mg/dL (ref 0–99)
Total CHOL/HDL Ratio: 2.4 RATIO
VLDL: 16 mg/dL (ref 0–40)

## 2012-09-13 LAB — TSH: TSH: 1.417 u[IU]/mL (ref 0.350–4.500)

## 2012-09-13 LAB — TROPONIN I: Troponin I: <0.3 ng/mL (ref <0.30)

## 2012-09-13 LAB — VITAMIN B12: Vitamin B-12: 373 pg/mL (ref 211–911)

## 2012-09-13 MED ORDER — ASPIRIN EC 325 MG PO TBEC
325.0000 mg | DELAYED_RELEASE_TABLET | Freq: Every day | ORAL | Status: DC
  Administered 2012-09-14: 325 mg via ORAL
  Filled 2012-09-13: qty 1

## 2012-09-13 MED ORDER — INSULIN GLARGINE 100 UNIT/ML ~~LOC~~ SOLN
5.0000 [IU] | Freq: Every day | SUBCUTANEOUS | Status: DC
  Administered 2012-09-13: 5 [IU] via SUBCUTANEOUS

## 2012-09-13 MED ORDER — SODIUM CHLORIDE 0.9 % IV SOLN
INTRAVENOUS | Status: DC
  Administered 2012-09-13 – 2012-09-14 (×2): via INTRAVENOUS

## 2012-09-13 NOTE — Progress Notes (Signed)
Patient oxygen saturations dropping into the mid 70's as patient is dozing of to sleep in the chair. Patient placed on 1 L N/C, 02 saturations increased to the high 90's. Dr. On call notified and made aware. Will continue to monitor patient.

## 2012-09-13 NOTE — Progress Notes (Signed)
VASCULAR LAB PRELIMINARY  PRELIMINARY  PRELIMINARY  PRELIMINARY  Carotid duplex completed.    Preliminary report:  Right:  No evidence of hemodynamically significant internal carotid artery stenosis.  Left:  40-59% internal carotid artery stenosis.  Bilateral vertebral artery flow is antegrade.  Bernadean Saling, RVS 09/13/2012, 4:21 PM

## 2012-09-13 NOTE — Progress Notes (Signed)
Physical Therapy Evaluation Patient Details Name: Douglas May MRN: [id number] DOB: 12-22-1923 Today's Date: 09/13/2012 Time: 1308-6578 PT Time Calculation (min): 37 min  PT Assessment / Plan / Recommendation Clinical Impression  Patient is an 78 yo male admitted following a syncopal episode.  Patient did well with mobility today.  He has 24 hour assist at home.  Recommend HHPT at discharge for continued therapy and home safety eval.  Patient will benefit from acute PT to maximize independence prior to discharge home with daughter.    PT Assessment  Patient needs continued PT services    Follow Up Recommendations  Home health PT;Supervision/Assistance - 24 hour    Does the patient have the potential to tolerate intense rehabilitation      Barriers to Discharge None      Equipment Recommendations  None recommended by PT    Recommendations for Other Services     Frequency Min 3X/week    Precautions / Restrictions Precautions Precautions: Fall Precaution Comments: Left shoulder injury/dislocated.  Don't pull on LUE. Restrictions Weight Bearing Restrictions: No   Pertinent Vitals/Pain       Mobility  Bed Mobility Bed Mobility: Supine to Sit;Sitting - Scoot to Edge of Bed Supine to Sit: 4: Min guard;With rails;HOB flat Sitting - Scoot to Edge of Bed: 4: Min guard Details for Bed Mobility Assistance: Verbal cues for technique and safety. Transfers Transfers: Sit to Stand;Stand to Sit Sit to Stand: 4: Min assist;With upper extremity assist;From bed Stand to Sit: 4: Min assist;With upper extremity assist;With armrests;To chair/3-in-1 Details for Transfer Assistance: Verbal cues for hand placement.  Assist needed for balance, and to control descent into chair. Ambulation/Gait Ambulation/Gait Assistance: 4: Min assist Ambulation Distance (Feet): 86 Feet Assistive device: Rolling walker Ambulation/Gait Assistance Details: Verbal cues for safe use of RW, especially  during turns.  Patient with flexed posture during gait. Gait Pattern: Step-through pattern;Decreased stride length;Shuffle;Trunk flexed Gait velocity: Slow gait speed Modified Rankin (Stroke Patients Only) Pre-Morbid Rankin Score: Moderate disability Modified Rankin: Moderately severe disability           PT Diagnosis: Difficulty walking;Abnormality of gait;Generalized weakness  PT Problem List: Decreased strength;Decreased range of motion;Decreased activity tolerance;Decreased balance;Decreased mobility;Decreased knowledge of use of DME;Pain PT Treatment Interventions: DME instruction;Gait training;Functional mobility training;Balance training;Patient/family education   PT Goals Acute Rehab PT Goals PT Goal Formulation: With patient Time For Goal Achievement: 09/20/12 Potential to Achieve Goals: Good Pt will go Sit to Stand: with modified independence PT Goal: Sit to Stand - Progress: Goal set today Pt will go Stand to Sit: with modified independence PT Goal: Stand to Sit - Progress: Goal set today Pt will Ambulate: 51 - 150 feet;with modified independence;with rolling walker PT Goal: Ambulate - Progress: Goal set today Pt will Go Up / Down Stairs: 1-2 stairs;with min assist;with rail(s);with least restrictive assistive device PT Goal: Up/Down Stairs - Progress: Goal set today  Visit Information  Last PT Received On: 09/13/12 Assistance Needed: +1    Subjective Data  Subjective: "I'd like to get back to [place]"  Patient from [facility], but staying with daughter in [place] currently. Patient Stated Goal: To go home   Prior Functioning  Home Living Lives With: Daughter (Son-in-law) Available Help at Discharge: Family;Available 24 hours/day Type of Home: House Home Access: Stairs to enter [employer] of Steps: 2 Entrance Stairs-Rails: Right;Left Home Layout: One level Bathroom Shower/Tub: Engineer, manufacturing systems: Standard Bathroom Accessibility: Yes How  Accessible: Accessible via walker Home Adaptive  Equipment: Straight cane;Walker - four wheeled;Shower chair with back;Grab bars around toilet Prior Function Level of Independence: Independent with assistive device(s);Needs assistance Needs Assistance: Bathing;Meal Prep;Light Housekeeping Bath: Minimal Meal Prep: Total Light Housekeeping: Total Able to Take Stairs?: Yes (with assist) Driving: No Vocation: Retired Musician: Expressive difficulties;HOH (Difficult to understand at times) Dominant Hand: Right    Cognition  Overall Cognitive Status: Appears within functional limits for tasks assessed/performed (Difficult to assess due to [facility]) Arousal/Alertness: Awake/alert Orientation Level: Oriented X4 / Intact Behavior During Session: [facility] for tasks performed Cognition - Other Comments: Patient does repeat himself during conversation.    Extremity/Trunk Assessment Right Upper Extremity Assessment RUE ROM/Strength/Tone: [facility] for tasks assessed Left Upper Extremity Assessment LUE ROM/Strength/Tone: Deficits;Unable to fully assess;Due to pain LUE ROM/Strength/Tone Deficits: Shoulder injury/dislocated.  Able to use UE on RW for balance. Right Lower Extremity Assessment RLE ROM/Strength/Tone: Deficits RLE ROM/Strength/Tone Deficits: Weakness 4-/5 RLE Sensation: WFL - Light Touch Left Lower Extremity Assessment LLE ROM/Strength/Tone: Deficits LLE ROM/Strength/Tone Deficits: Strength 4-/5 LLE Sensation: WFL - Light Touch Trunk Assessment Trunk Assessment: Kyphotic   Balance Balance Balance Assessed: Yes Static Sitting Balance Static Sitting - Balance Support: Right upper extremity supported;Feet supported Static Sitting - Level of Assistance: 5: Stand by assistance Static Sitting - Comment/# of Minutes: Patient able to maintain upright sitting balance x 6 minutes on EOB.  End of Session PT - End of Session Equipment Utilized During Treatment: Gait belt Activity  Tolerance: Patient limited by fatigue Patient left: in chair;with call bell/phone within reach Nurse Communication: Mobility status  GP     Vena Austria 09/13/2012, 10:40 AM Durenda Hurt. Renaldo Fiddler, [facility] Acute Rehab Services Pager [phone]

## 2012-09-13 NOTE — Progress Notes (Signed)
Hypoglycemic Event  CBG: 54  Treatment: patient received a 15 gram snack, and some orange juice.  Symptoms: patient explained that he was hungry, patient also appeared pale.  Follow-up CBG: Time:2122 CBG Result:139  Possible Reasons for Event: Patient did not consume enough before administration of insulin coverage.  Comments/MD notified:  No further instructions or orders at this time. Dr. On call notified, and made aware.   Lenna Gilford Dominic  Remember to initiate Hypoglycemia Order Set & complete

## 2012-09-13 NOTE — Progress Notes (Signed)
  Echocardiogram 2D Echocardiogram has been performed.  Douglas May 09/13/2012, 11:41 AM

## 2012-09-13 NOTE — Progress Notes (Signed)
[facility] PROGRESS NOTE  Douglas May GNF:[id number] DOB: [birth year] DOA: 09/12/2012 PCP: Benita Stabile, MD  Assessment/Plan:  Spoke at length with his daughter regarding events leading up to admission who corrected the initial history slightly.  Per her, the patient has had morning fingersticks in the morning before breakfast between 75 and 95, however, for the last week, he has had a difficult time getting blood from his finger first thing in the morning so he has been check after breakfast.  His fingersticks after breakfast have only been in the low 100s.  He has history of overnight hypoglycemia as well, although he no longer wakes up at night to check.  He eats graham crackers with peanut better before bed every night instead.  On the day of presentation, he woke up feeling fatigued and then suddenly felt ill while preparing breakfast.  He sat down and passed out for about 15 minutes.  His fingerstick when EMS arrives was 132.    Syncope:  MRI was negative for acute stroke, but demonstrated evidence of microvascular disease and old infarcts.  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  Spoke with radiology regarding these findings who recommended against procedures and follow up with neurology after risk factor modification.  Patient has had PVC on telemetry, but no other arrythmias, and troponins are negative x 3.  ECHO is pending.  Due to possibility of worsening stenosis of CA over last 4 -5 months, will repeat Carotid duplex.  Given history of possible borderline hypoglycemia at home with hypoglycemic event in hospital to 50 this admission, increased suspicion that syncopal episode was related to hypoglycemia despite fingerstick recovery when EMS arrived.  May also have been related to dehydration as patient's blood work  supports hypovolemia -  Increase aspirin to 325mg  daily given atherosclerotic disease -  Lipid panel at goal, see below -  A1c pending -  F/u ECHO -  F/u carotid duplex -  Urine culture negative -  Decrease insulin to 5 units -  Normal saline infusion -  Continue telemetry  Lab Results  Component Value Date   CHOL 96 09/13/2012   HDL 40 09/13/2012   LDLCALC 40 09/13/2012   TRIG 82 09/13/2012   CHOLHDL 2.4 09/13/2012    Stroke history:  Risk factor modification as above. CAD/HTN/HLD:  Risk factor modification as above.  Blood pressure within normal range -  Outpatient telemetry as high risk for arrhythmias given CAD  T2DM, hypoglycemic overnight -  Reduce insulin to 5 units -  D/C mealtime insulin for now and follow fingersticks as low fingerstick occurred at 8pm. In hospital. -  F/u A1c  Normocytic anemia, hemoglobin stable.  Defer to PCP  Elevated BUN:Cr:  Suggestive of dehydration -  Gentle hydration and recheck electrolytes in AM  DIET:  Healthy heart, diabetic ACCESS:  PIV IVF:  Normal saline at 48ml/h  PROPH:  heparin  Code Status: full code Family Communication: Spoke with patient and daughter Disposition Plan: Pending results of ECHO and trending fingersticks overnight   Consultants:  None  Procedures:  ECHO 10/13  MRI/MRA 10/13  Carotid duplex 10/13  Antibiotics:  None  HPI/Subjective:  Continues to feel weak, particularly with ambulating.  Denies lightheadedness, nausea, vomiting, diarrhea, constipation.    Objective: Filed Vitals:   09/13/12 0400 09/13/12 0600 09/13/12 1005 09/13/12 1308  BP: 112/49 126/47 147/70 122/62  Pulse: 64 62 72 68  Temp: 98.1 F (36.7 C) 98.2 F (36.8 C) 98.2 F (36.8 C) 97.9 F (36.6 C)  TempSrc:   Oral Oral  Resp: 12 16 20 20   Height:  5\' 2"  (1.575 m)    Weight:  68.176 kg (150 lb 4.8 oz)    SpO2: 95% 97% 98% 99%    Intake/Output Summary (Last 24 hours) at 09/13/12 1427 Last data filed at 09/13/12  1244  Gross per 24 hour  Intake    480 ml  Output    650 ml  Net   -170 ml   Filed Weights   09/12/12 1018 09/13/12 0600  Weight: 67.132 kg (148 lb) 68.176 kg (150 lb 4.8 oz)    Exam:   General:  Caucasian male, no acute distress,  HEENT:  Mildly dry MM  Cardiovascular:  RRR, nl S1, S2  Respiratory: CTAB  Abdomen: NABS, soft, nondistended, nontender  Neuro:  Face symmetric, tongue midline.  Persistent dysarthria even with dentures in.  Strength 5-/5 RUE/RLE and 5/5 LUE/LLE.  3+ reflex of the right patella with adduction of the left leg.  Remaining extremities 2+.    Data Reviewed: Basic Metabolic Panel:  Lab 09/13/12 1610 09/12/12 2204 09/12/12 1054  NA 142 -- 140  K 4.3 -- 5.4*  CL 108 -- 107  CO2 27 -- 28  GLUCOSE 119* -- 132*  BUN 30* -- 26*  CREATININE 1.35 1.35 1.28  CALCIUM 9.1 -- 9.8  MG -- -- --  PHOS -- -- --   Liver Function Tests:  Lab 09/13/12 0713  AST 15  ALT 10  ALKPHOS 55  BILITOT 0.3  PROT 5.2*  ALBUMIN 3.2*   No results found for this basename: LIPASE:5,AMYLASE:5 in the last 168 hours No results found for this basename: AMMONIA:5 in the last 168 hours CBC:  Lab 09/13/12 0713 09/12/12 2204 09/12/12 1054  WBC 7.5 9.0 7.2  NEUTROABS -- -- 5.0  HGB 10.4* 11.0* 11.1*  HCT 32.1* 33.6* 34.2*  MCV 96.4 97.1 96.3  PLT 221 248 253   Cardiac Enzymes:  Lab 09/13/12 0713 09/12/12 2218 09/12/12 1055  CKTOTAL -- -- --  CKMB -- -- --  CKMBINDEX -- -- --  TROPONINI <0.30 <0.30 <0.30   BNP (last 3 results) No results found for this basename: PROBNP:3 in the last 8760 hours CBG:  Lab 09/13/12 1131 09/13/12 0748 09/12/12 2122 09/12/12 2019 09/12/12 1742  GLUCAP 100* 130* 139* 54* 226*    No results found for this or any previous visit (from the past 240 hour(s)).   Studies: Dg Chest 2 View  09/12/2012  *RADIOLOGY REPORT*  Clinical Data: Weakness and syncope  CHEST - 2 VIEW  Comparison: 11/25/2005  Findings: Postsurgical changes are  again seen.  The heart and pulmonary vascularity are within normal limits.  A hiatal hernia is noted.  The lungs are clear bilaterally.  Chronic changes in the proximal left humerus are noted.  IMPRESSION: No acute abnormality is seen.   Original Report Authenticated By: Phillips Odor, M.D.    Ct Head Wo Contrast  09/12/2012  *RADIOLOGY REPORT*  Clinical Data: Sudden onset weakness and syncope, no trauma  CT HEAD WITHOUT CONTRAST  Technique:  Contiguous axial images were obtained from the base of the skull through the vertex without contrast.  Comparison: MR brain 07/18/2012  Findings: Calvarium is intact.  No hemorrhage, infarct, or mass. Diffuse cortical atrophy.  Severe periventricular white matter low attenuation consistent  with chronic small vessel ischemia with encephalomalacia left occipital lobe.  IMPRESSION: No acute findings.  Severe chronic age related involutional change.   Original Report Authenticated By: Otilio Carpen, M.D.    Mri Brain Without Contrast  09/13/2012  *RADIOLOGY REPORT*  Clinical Data:  Syncopal episode yesterday morning.  Rule out stroke.  MRI HEAD WITHOUT CONTRAST MRA HEAD WITHOUT CONTRAST  Technique:  Multiplanar, multiecho pulse sequences of the brain and surrounding structures were obtained without intravenous contrast. Angiographic images of the head were obtained using MRA technique without contrast.  Comparison:  CT head without contrast 09/12/2012.  MRI brain 07/18/2012.  MRI HEAD  Findings:  The diffusion weighted images demonstrate no evidence for acute or subacute infarction.  A remote hemorrhagic infarction of the left occipital lobe is stable.  No acute hemorrhage or mass lesion is present.  Moderate generalized atrophy is present.  Extensive confluent periventricular and subcortical white matter disease is present bilaterally.  Remote lacunar infarction noted at right caudate head and anterior right frontal white matter.  White matter changes extend into the  left central pons as previously seen.  Flow is present in the major intracranial arteries.  The patient is status post right lens extraction.  Mild mucosal thickening is present in the left frontal sinus.  The paranasal sinuses are otherwise clear.  The mastoid air cells are clear.  IMPRESSION:  1.  No acute intracranial abnormality or significant interval change. 2.  Remote left occipital hemorrhagic infarct. 3.  Stable atrophy and extensive white matter disease.  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  There is mild atherosclerotic irregularity within the cavernous carotid arteries bilaterally, worse on the left.  No focal stenosis is evident.  The A1 and M1 segments are normal.  The MCA bifurcations are within normal limits.  Mild to moderate attenuation of MCA branch vessels is present without significant proximal stenosis or occlusion.  The right vertebral artery is the dominant vessel.  Dominant AICA vessels are present bilaterally.  Both posterior cerebral arteries originate from a normal basilar artery.  Asymmetric irregularities noted within the proximal left posterior cerebral artery without a significant stenosis of greater than 50%.  There is attenuation of distal PCA branch vessels as well.  IMPRESSION:  1.  Mild to moderate small vessel disease. 2.  More proximal atherosclerotic changes in the left posterior cerebral artery on the left. 3.  Mild irregularity within the left cavernous carotid artery without significant stenosis. 4.  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   Original Report Authenticated By: Jamesetta Orleans. MATTERN, M.D.    Mr Maxine Glenn Head/brain Wo Cm  09/13/2012  *RADIOLOGY REPORT*  Clinical Data:  Syncopal episode yesterday morning.  Rule out stroke.  MRI HEAD WITHOUT CONTRAST MRA HEAD WITHOUT CONTRAST  Technique:  Multiplanar, multiecho pulse sequences of the brain and surrounding structures were obtained without intravenous  contrast. Angiographic images of the head were obtained using MRA technique without contrast.  Comparison:  CT head without contrast 09/12/2012.  MRI brain 07/18/2012.  MRI HEAD  Findings:  The diffusion weighted images demonstrate no evidence for acute or subacute infarction.  A remote hemorrhagic infarction of the left occipital lobe is stable.  No acute hemorrhage or mass lesion is present.  Moderate generalized atrophy is present.  Extensive confluent periventricular and subcortical white matter disease is present bilaterally.  Remote lacunar infarction noted at right caudate head and anterior right frontal white matter.  White matter changes extend into the left central pons as previously seen.  Flow is present in the major intracranial arteries.  The patient is status post right lens extraction.  Mild mucosal thickening is present in the left frontal sinus.  The paranasal sinuses are otherwise clear.  The mastoid air cells are clear.  IMPRESSION:  1.  No acute intracranial abnormality or significant interval change. 2.  Remote left occipital hemorrhagic infarct. 3.  Stable atrophy and extensive white matter disease.  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  There is mild atherosclerotic irregularity within the cavernous carotid arteries bilaterally, worse on the left.  No focal stenosis is evident.  The A1 and M1 segments are normal.  The MCA bifurcations are within normal limits.  Mild to moderate attenuation of MCA branch vessels is present without significant proximal stenosis or occlusion.  The right vertebral artery is the dominant vessel.  Dominant AICA vessels are present bilaterally.  Both posterior cerebral arteries originate from a normal basilar artery.  Asymmetric irregularities noted within the proximal left posterior cerebral artery without a significant stenosis of greater than 50%.  There is attenuation of distal PCA branch vessels as well.   IMPRESSION:  1.  Mild to moderate small vessel disease. 2.  More proximal atherosclerotic changes in the left posterior cerebral artery on the left. 3.  Mild irregularity within the left cavernous carotid artery without significant stenosis. 4.  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   Original Report Authenticated By: Jamesetta Orleans. MATTERN, M.D.     Scheduled Meds:   . aspirin EC  325 mg Oral Daily  . atorvastatin  40 mg Oral q1800  . docusate sodium  100 mg Oral BID  . ferrous sulfate  325 mg Oral Q breakfast  . heparin  5,000 Units Subcutaneous Q8H  . insulin aspart  0-5 Units Subcutaneous QHS  . insulin aspart  0-9 Units Subcutaneous TID WC  . insulin aspart  3 Units Subcutaneous TID WC  . insulin glargine  5 Units Subcutaneous QHS  . irbesartan  75 mg Oral QHS  . metoprolol succinate  50 mg Oral Daily  . multivitamin with minerals  1 tablet Oral Daily  . senna  1 tablet Oral BID  . sodium chloride  3 mL Intravenous Q12H  . DISCONTD: aspirin EC  81 mg Oral Daily  . DISCONTD: aspirin  81 mg Oral Daily  . DISCONTD: docusate sodium  100 mg Oral BID  . DISCONTD: heparin  5,000 Units Subcutaneous Q8H  . DISCONTD: insulin glargine  10 Units Subcutaneous QHS  . DISCONTD: sodium chloride  3 mL Intravenous Q12H   Continuous Infusions:   . DISCONTD: sodium chloride      Principal Problem:  *Syncope and collapse Active Problems:  HTN (hypertension)  Diabetes mellitus type 2, controlled  Hx of CABG  CVA  05/2012 in [place]  Hypercholesteremia  Anemia    Time spent: 30    Nigel Ericsson, [facility]  [facility] Pager [phone]. If 8PM-8AM, please contact night-coverage at [email or web address], password [facility] 09/13/2012, 2:27 PM  LOS: 1 day

## 2012-09-14 DIAGNOSIS — Z951 Presence of aortocoronary bypass graft: Secondary | ICD-10-CM

## 2012-09-14 DIAGNOSIS — I511 Rupture of chordae tendineae, not elsewhere classified: Secondary | ICD-10-CM

## 2012-09-14 DIAGNOSIS — I340 Nonrheumatic mitral (valve) insufficiency: Secondary | ICD-10-CM

## 2012-09-14 LAB — BASIC METABOLIC PANEL
BUN: 21 mg/dL (ref 6–23)
Calcium: 7.6 mg/dL — ABNORMAL LOW (ref 8.4–10.5)
Creatinine, Ser: 0.99 mg/dL (ref 0.50–1.35)
GFR calc Af Amer: 83 mL/min — ABNORMAL LOW (ref >90)

## 2012-09-14 LAB — GLUCOSE, CAPILLARY: Glucose-Capillary: 113 mg/dL — ABNORMAL HIGH (ref 70–99)

## 2012-09-14 LAB — CBC
HCT: 27.7 % — ABNORMAL LOW (ref 39.0–52.0)
MCH: 31.6 pg (ref 26.0–34.0)
MCV: 96.2 fL (ref 78.0–100.0)
Platelets: 199 10*3/uL (ref 150–400)
RDW: 13.6 % (ref 11.5–15.5)

## 2012-09-14 LAB — FOLATE RBC: RBC Folate: 1548 ng/mL — ABNORMAL HIGH (ref >=366)

## 2012-09-14 MED ORDER — ASPIRIN 325 MG PO TBEC: 325.0000 mg | DELAYED_RELEASE_TABLET | Freq: Every day | ORAL | Status: DC

## 2012-09-14 MED ORDER — POTASSIUM CHLORIDE CRYS ER 20 MEQ PO TBCR
40.0000 meq | EXTENDED_RELEASE_TABLET | Freq: Once | ORAL | Status: AC
  Administered 2012-09-14: 40 meq via ORAL
  Filled 2012-09-14: qty 2

## 2012-09-14 NOTE — Discharge Summary (Signed)
Physician Discharge Summary  Douglas May EAV:[id number] DOB: March 25, 1924 DOA: 09/12/2012  PCP: Benita Stabile, MD  Admit date: 09/12/2012 Discharge date: 09/14/2012  Recommendations for Outpatient Follow-up:   Resume previous neuro rehabilitation   Family to STOP insulin.  Continue fingersticks in the mornings and call PCP if >250.  Review in one week.    Follow up with primary care doctor within 1 week for review of fingersticks and repeat BMP and CBC.  Defer work up of anemia to primary care physician if not already complete.  Finally, please order EEG if possible so that it can be completed before Neurology follow up if you feel that this is reasonable.    Follow up with cardiology for outpatient telemetry and follow up of moderate mitral regurgitation and CAD.  Please follow up with Neurology within 1 month.  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 male  There is an 78 year old male with past medical history of diabetes type 2, history of CVA in June 2013, hypertension that comes in as he passed out this morning. As per daughter which is at bedside and giving the history he came down from his room, started to make his breakfast when he said he didn't feel well and sat down. Lost consciousness for about 10 minutes and 6 EMS got there he was awake and making jokes. She did not see any seizure-like activity. The patient denies  any chest pain shortness of breath palpitations. He just felt tired before this episode. His blood glucose was checked in the morning before he passed out by his daughter and it was 55 .He relates no prodromal symptoms. He's had no fever chills nausea vomiting or diarrhea.   Hospital Course:   Syncope in patient with history of CVA and TIA:  Most likely, patient had orthostasis-induced syncope.  MRI was negative for acute stroke, but demonstrated evidence of microvascular disease and old infarcts. MRA demonstrated a <50% occlusion of the left posterior cerebral artery, diffuse atherosclerotic disease without significant stenosis, and a 2.14mm aneurysm of the left posterior communicating artery. Spoke with radiology regarding these findings who recommended against procedures and neurology consultation.  Patient had PVCs on telemetry, but no other arrythmias, and troponins were negative x 3.  ECHO demonstrated preserved ejection fraction with grade 1 diastolic dysfunction, moderate MR with mild flail motion due to rupture of the primary chord.  Carotid duplex demonstrated stable 40-59% stenosis of the right ICA.   His aspirin was increased from 81mg  to 325mg  daily for secondary stroke prevention.  His lipid panel was at goal and his hemoglobin A1c was 6.1, below threshold for diabetes.  He was given gentle hydration.  Because he continued to feel weak and have difficulty walking a day after the primary event, Neurology was consulted.  They recommended outpatient EEG prior to follow up with Neurology as an outpatient.      Lab Results  Component Value Date   HGBA1C 6.1* 09/12/2012  Lab Results   Component  Value  Date    CHOL  96  09/13/2012    HDL  40  09/13/2012    LDLCALC  40  09/13/2012    TRIG  82  09/13/2012    CHOLHDL  2.4  09/13/2012     T2DM, resolved.  Given history of possible borderline hypoglycemia at home with hypoglycemic event in hospital to 50 this admission.  After conferring  with the diabetes nurse coordinator and reviewing A1c and fingersticks, the risk of recurrent hypoglycemia is considered high.  Recommended discontinuing insulin.  The family may continue AM fingersticks at home and review the numbers with the primary care doctor in one week to determine further plan of care.  Consider repeat hemoglobin A1c in 3 months and recommend liberalizing A1c goal to prevent hypoglycemia.    CAD/HTN/HLD: Risk factor modification as above. Blood pressure within normal range.  Outpatient telemetry will be arranged.    Normocytic anemia, hemoglobin decreased slightly.  No evidence of hemolysis and he did not have gross blood in his stools.  Likely dilutional as all blood counts decreased correspondingly after IV fluids.  Repeat CBC within 5 days by primary care physician and work up for anemia as indicated.  Elevated BUN:Cr: Suggestive of dehydration and resolved with gentle hydration.  Hypokalemia corrected with oral potassium.     Consultants:  Neurology Procedures:  CT head 10/12 ECHO 10/13  MRI/MRA 10/13  Carotid duplex 10/13 Antibiotics:  None   Discharge Exam: Filed Vitals:   09/14/12 1638  BP: 137/71  Pulse: 74  Temp: 98.1 F (36.7 C)  Resp: 16   Filed Vitals:   09/14/12 0600 09/14/12 0800 09/14/12 1154 09/14/12 1638  BP: 115/59 130/66 174/66 137/71  Pulse: 58 66 60 74  Temp: 98.4 F (36.9 C) 97.7 F (36.5 C) 98.1 F (36.7 C) 98.1 F (36.7 C)  TempSrc:  Oral Oral Oral  Resp: 17 16 16 16   Height:      Weight: 66.724 kg (147 lb 1.6 oz)     SpO2: 94% 95% 98% 98%    General: Caucasian male, no acute distress,  HEENT: Mildly dry MM  Cardiovascular: RRR, nl S1, S2  Respiratory: CTAB  Abdomen: NABS, soft, nondistended, nontender  Neuro: Face symmetric, tongue midline. Strength 5-/5 RUE/RLE and 5/5 LUE/LLE. 3+ reflex of the right patella with adduction of the left leg. Remaining extremities 2+.    Discharge Instructions      Discharge  Orders    Future Appointments: Provider: Department: Dept Phone: Center:   09/15/2012 8:30 AM [facility] [phone] None   09/15/2012 9:45 AM Marlis Edelson, OT [facility] [phone] [facility]   09/15/2012 10:30 AM Fraser Din, PT [facility] [phone] [facility]   09/15/2012 11:15 AM Barron Alvine, CCC-SLP [facility] [phone] [facility]   09/17/2012 9:45 AM Barron Alvine, CCC-SLP [facility] [phone] [facility]   09/17/2012 10:30 AM Marlis Edelson, OT [facility] [phone] [facility]   09/17/2012 11:15 AM Fraser Din, PT [facility] [phone] [facility]   09/22/2012 9:45 AM Nicki Reaper Feltis, PT [facility] [phone] [facility]   09/22/2012 10:30 AM [facility] [phone] [facility]   09/23/2012 9:00 AM Berneice Heinrich, PT [facility] [phone] [facility]   09/23/2012 10:30 AM [facility] 2 [facility] [phone] [facility]   09/29/2012 1:45 PM Nicki Reaper Feltis, PT [facility] [phone] [facility]   09/29/2012 2:30 PM Marlis Edelson, OT  [facility] [phone] [facility]   10/01/2012 1:45 PM Nicki Reaper Feltis, PT [facility] [phone] [facility]   10/01/2012 3:30 PM Marlis Edelson, OT [facility] [phone] [facility]     Future Orders Please Complete By Expires   Diet - low sodium heart healthy      Call MD for:      Comments:   Call 911 if you have chest pain, shortness of breath, slurred speech, confusion, numbness or weakness of an arm or leg, or facial droop.   Call MD for:  temperature >100.4      Call MD for:  persistant nausea and vomiting      Call MD for:  severe uncontrolled pain      Call MD for:  difficulty breathing, headache or visual disturbances      Call MD for:  hives      Call MD for:  persistant dizziness or light-headedness      Call MD for:  extreme fatigue      Discharge instructions      Comments:   You were hospitalized because you  passed out.  You were likely dehydrated according to Neurology.  Please drink at least 1L of water every day.  You had an MRI of your brain which did not show any new strokes.  You had an MRA which demonstrated some narrowing of the blood vessels within the brain including a small 2.5mm aneurysm.  Your ultrasound of your neck showed that the narrowing of the right artery is about 50%, about the same as before.  You had your heart rate monitored on telemetry which showed some premature contractions, but you are at risk for other heart rhythm problems and Neurology is recommending outpatient telemetry.  Your ultrasound of your heart showed one of your heart valves is leaky, you have mitral valve regurgitation.  Your heart function is normal and you have a mildly stiff heart.  Please follow up with cardiology within a couple of weeks.  Please talk to your primary care doctor and your insurance company regarding which Neurology offices in the area take your insurance and which EEG offices are preferred on your plan.  Please ask your primary care doctor for an EEG referral which can then be sent to the Neurologist of your choice.  Finally, your fingersticks have been borderline low at home and your hemoglobin A1c is in the nondiabetic range.  Please stop your insulin and check your fingersticks in the morning for the next week.  Please have your primary care doctor review the results.   Increase activity slowly          Medication List     As of 09/14/2012  6:47 PM    STOP taking these medications         aspirin 81 MG tablet      insulin glargine 100 UNIT/ML injection   Commonly known as: LANTUS      TAKE these medications         acetaminophen 650 MG CR tablet   Commonly known as: TYLENOL   Take 650 mg by mouth every 4 (four) hours as needed. For pain      aspirin 325 MG EC tablet   Take 1 tablet (325 mg total) by mouth daily.      atorvastatin 40 MG tablet   Commonly known as: LIPITOR    Take 40 mg by mouth daily.      CALCIUM-VITAMIN D PO   Take 1  tablet by mouth daily.      docusate sodium 100 MG capsule   Commonly known as: COLACE   Take 100 mg by mouth 2 (two) times daily.      ferrous sulfate 325 (65 FE) MG tablet   Take 325 mg by mouth daily with breakfast.      irbesartan 75 MG tablet   Commonly known as: AVAPRO   Take 75 mg by mouth at bedtime.      metoprolol succinate 50 MG 24 hr tablet   Commonly known as: TOPROL-XL   Take 50 mg by mouth daily. Take with or immediately following a meal.      multivitamin with minerals Tabs   Take 1 tablet by mouth daily.      naproxen 500 MG tablet   Commonly known as: NAPROSYN   Take 500 mg by mouth daily as needed.      nitroGLYCERIN 0.4 MG SL tablet   Commonly known as: NITROSTAT   Place 0.4 mg under the tongue every 5 (five) minutes as needed. Chest pain      polyethylene glycol packet   Commonly known as: MIRALAX / GLYCOLAX   Take 17 g by mouth daily as needed. constipation      PRESERVISION AREDS PO   Take 1 capsule by mouth 2 (two) times daily.         Follow-up Information    Follow up with Benita Stabile, MD. In 1 week. (Review fingersticks, CBC, and BMP)    Contact information:   [facility] [address], [place] [address] [phone]       Follow up with [facility], MD. Schedule an appointment as soon as possible for a visit in 2 weeks. (telemetry, mitral regurgitation)    Contact information:   [address] [place] [address] [phone]       Follow up with Neurology. Schedule an appointment as soon as possible for a visit in 1 month. (EEG only needs to be done before Neurology appointment.)    Contact information:   Please contact [employer] regarding Neurology offices that are preferred AND regarding which EEG facilities are preferred. Please ask your primary care physician for referral for EEG and assistance with  where to go for the test.             The results of significant diagnostics from this hospitalization (including imaging, microbiology, ancillary and laboratory) are listed below for reference.    Significant Diagnostic Studies: Dg Chest 2 View  09/12/2012  *RADIOLOGY REPORT*  Clinical Data: Weakness and syncope  CHEST - 2 VIEW  Comparison: 11/25/2005  Findings: Postsurgical changes are again seen.  The heart and pulmonary vascularity are within normal limits.  A hiatal hernia is noted.  The lungs are clear bilaterally.  Chronic changes in the proximal left humerus are noted.  IMPRESSION: No acute abnormality is seen.   Original Report Authenticated By: Phillips Odor, M.D.    Ct Head Wo Contrast  09/12/2012  *RADIOLOGY REPORT*  Clinical Data: Sudden onset weakness and syncope, no trauma  CT HEAD WITHOUT CONTRAST  Technique:  Contiguous axial images were obtained from the base of the skull through the vertex without contrast.  Comparison: MR brain 07/18/2012  Findings: Calvarium is intact.  No hemorrhage, infarct, or mass. Diffuse cortical atrophy.  Severe periventricular white matter low attenuation consistent with chronic small vessel ischemia with encephalomalacia left occipital lobe.  IMPRESSION: No acute findings.  Severe chronic age related involutional change.   Original Report Authenticated By: Otilio Carpen, M.D.    Mri Brain Without Contrast  09/13/2012  *RADIOLOGY REPORT*  Clinical Data:  Syncopal episode yesterday morning.  Rule out stroke.  MRI HEAD WITHOUT CONTRAST MRA HEAD WITHOUT CONTRAST  Technique:  Multiplanar, multiecho pulse sequences of the brain and surrounding structures were obtained without intravenous contrast. Angiographic images of the head were obtained using MRA technique without contrast.  Comparison:  CT head without contrast 09/12/2012.  MRI brain 07/18/2012.  MRI HEAD  Findings:  The diffusion weighted images demonstrate no evidence for acute or subacute  infarction.  A remote hemorrhagic infarction of the left occipital lobe is stable.  No acute hemorrhage or mass lesion is present.  Moderate generalized atrophy is present.  Extensive confluent periventricular and subcortical white matter disease is present bilaterally.  Remote lacunar infarction noted at right caudate head and anterior right frontal white matter.  White matter changes extend into the left central pons as previously seen.  Flow is present in the major intracranial arteries.  The patient is status post right lens extraction.  Mild mucosal thickening is present in the left frontal sinus.  The paranasal sinuses are otherwise clear.  The mastoid air cells are clear.  IMPRESSION:  1.  No acute intracranial abnormality or significant interval change. 2.  Remote left occipital hemorrhagic infarct. 3.  Stable atrophy and extensive white matter disease.  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  There is mild atherosclerotic irregularity within the cavernous carotid arteries bilaterally, worse on the left.  No focal stenosis is evident.  The A1 and M1 segments are normal.  The MCA bifurcations are within normal limits.  Mild to moderate attenuation of MCA branch vessels is present without significant proximal stenosis or occlusion.  The right vertebral artery is the dominant vessel.  Dominant AICA vessels are present bilaterally.  Both posterior cerebral arteries originate from a normal basilar artery.  Asymmetric irregularities noted within the proximal left posterior cerebral artery without a significant stenosis of greater than 50%.  There is attenuation of distal PCA branch vessels as well.  IMPRESSION:  1.  Mild to moderate small vessel disease. 2.  More proximal atherosclerotic changes in the left posterior cerebral artery on the left. 3.  Mild irregularity within the left cavernous carotid artery without significant stenosis. 4.  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   Original Report Authenticated By: Jamesetta Orleans. MATTERN, M.D.    Mr Maxine Glenn Head/brain Wo Cm  09/13/2012  *RADIOLOGY REPORT*  Clinical Data:  Syncopal episode yesterday morning.  Rule out stroke.  MRI HEAD WITHOUT CONTRAST MRA HEAD WITHOUT CONTRAST  Technique:  Multiplanar, multiecho pulse sequences of the brain and surrounding structures were obtained without intravenous contrast. Angiographic images of the head were obtained using MRA technique without contrast.  Comparison:  CT head without contrast 09/12/2012.  MRI brain 07/18/2012.  MRI HEAD  Findings:  The diffusion weighted images demonstrate no evidence for acute or subacute infarction.  A remote hemorrhagic infarction of the left occipital lobe is stable.  No acute hemorrhage or mass lesion is present.  Moderate generalized atrophy is present.  Extensive confluent periventricular and subcortical white matter disease is present bilaterally.  Remote lacunar infarction noted at right caudate head and anterior right frontal white matter.  White matter changes extend into the left central pons as previously seen.  Flow is present  in the major intracranial arteries.  The patient is status post right lens extraction.  Mild mucosal thickening is present in the left frontal sinus.  The paranasal sinuses are otherwise clear.  The mastoid air cells are clear.  IMPRESSION:  1.  No acute intracranial abnormality or significant interval change. 2.  Remote left occipital hemorrhagic infarct. 3.  Stable atrophy and extensive white matter disease.  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  There is mild atherosclerotic irregularity within the cavernous carotid arteries bilaterally, worse on the left.  No focal stenosis is evident.  The A1 and M1 segments are normal.  The MCA bifurcations are within normal limits.  Mild to moderate attenuation of MCA branch vessels is present without significant  proximal stenosis or occlusion.  The right vertebral artery is the dominant vessel.  Dominant AICA vessels are present bilaterally.  Both posterior cerebral arteries originate from a normal basilar artery.  Asymmetric irregularities noted within the proximal left posterior cerebral artery without a significant stenosis of greater than 50%.  There is attenuation of distal PCA branch vessels as well.  IMPRESSION:  1.  Mild to moderate small vessel disease. 2.  More proximal atherosclerotic changes in the left posterior cerebral artery on the left. 3.  Mild irregularity within the left cavernous carotid artery without significant stenosis. 4.  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   Original Report Authenticated By: Jamesetta Orleans. MATTERN, M.D.     Microbiology: Recent Results (from the past 240 hour(s))  URINE CULTURE     Status: Normal   Collection Time   09/12/12  1:02 PM      Component Value Range Status Comment   Specimen Description URINE, CLEAN CATCH   Final    Special Requests NONE   Final    Culture  Setup Time 09/12/2012 18:50   Final    Colony Count 4,000 COLONIES/ML   Final    Culture INSIGNIFICANT GROWTH   Final    Report Status 09/13/2012 FINAL   Final      Labs: Basic Metabolic Panel:  Lab 09/14/12 1610 09/13/12 0713 09/12/12 2204 09/12/12 1054  NA 146* 142 -- 140  K 3.2* 4.3 -- 5.4*  CL 115* 108 -- 107  CO2 23 27 -- 28  GLUCOSE 86 119* -- 132*  BUN 21 30* -- 26*  CREATININE 0.99 1.35 1.35 1.28  CALCIUM 7.6* 9.1 -- 9.8  MG -- -- -- --  PHOS -- -- -- --   Liver Function Tests:  Lab 09/13/12 0713  AST 15  ALT 10  ALKPHOS 55  BILITOT 0.3  PROT 5.2*  ALBUMIN 3.2*   No results found for this basename: LIPASE:5,AMYLASE:5 in the last 168 hours No results found for this basename: AMMONIA:5 in the last 168 hours CBC:  Lab 09/14/12 0536 09/13/12 0713 09/12/12 2204 09/12/12 1054  WBC 6.3 7.5 9.0 7.2  NEUTROABS -- -- -- 5.0  HGB 9.1* 10.4* 11.0* 11.1*    HCT 27.7* 32.1* 33.6* 34.2*  MCV 96.2 96.4 97.1 96.3  PLT 199 221 248 253   Cardiac Enzymes:  Lab 09/13/12 0713 09/12/12 2218 09/12/12 1055  CKTOTAL -- -- --  CKMB -- -- --  CKMBINDEX -- -- --  TROPONINI <0.30 <0.30 <0.30   BNP: BNP (last 3 results) No results found for this basename: PROBNP:3 in the last 8760 hours CBG:  Lab 09/14/12 1628 09/14/12 1139 09/14/12 0719 09/13/12 2032 09/13/12 1659  GLUCAP 106* 107* 113* 140* 107*    Time coordinating discharge: 45 minutes  Signed:  Valda Christenson  [facility] 09/14/2012, 6:47 PM

## 2012-09-14 NOTE — Care Management Note (Signed)
    Page 1 of 1   09/14/2012     2:03:12 PM   CARE MANAGEMENT NOTE 09/14/2012  Patient:  Douglas May, Douglas May   Account Number:  [id number]  Date Initiated:  09/14/2012  Documentation initiated by:  GRAVES-BIGELOW,Ilsa Bonello  Subjective/Objective Assessment:   Pt admitted with syncope and clooapse. Pt is from home with daughter. Pt has been seeing PT at neuro rehab center on [address].     Action/Plan:   No needs from CM at this time.   Anticipated DC Date:  09/14/2012   Anticipated DC Plan:  HOME/SELF CARE      DC Planning Services  CM consult      Choice offered to / List presented to:             Status of service:  Completed, signed off Medicare Important Message given?   (If response is "NO", the following Medicare IM given date fields will be blank) Date Medicare IM given:   Date Additional Medicare IM given:    Discharge Disposition:  HOME/SELF CARE  Per UR Regulation:  Reviewed for med. necessity/level of care/duration of stay  If discussed at Long Length of Stay Meetings, dates discussed:    Comments:

## 2012-09-14 NOTE — Progress Notes (Signed)
Physical Therapy Treatment Patient Details Name: Douglas May MRN: [id number] DOB: [birth year] Today's Date: 09/14/2012 Time: 4098-1191 PT Time Calculation (min): 22 min  PT Assessment / Plan / Recommendation Comments on Treatment Session  Pt is 78 yo male s/p syncopal episode who is progressing well with mobility, ambulated 300' with RW and min A. Recommend return to outpt PT at d/c, PT will continue to follow acutely.    Follow Up Recommendations  Outpatient PT;Supervision/Assistance - 24 hour     Does the patient have the potential to tolerate intense rehabilitation     Barriers to Discharge        Equipment Recommendations  None recommended by PT    Recommendations for Other Services    Frequency Min 3X/week   Plan Frequency remains appropriate;Discharge plan needs to be updated    Precautions / Restrictions Precautions Precautions: Fall Precaution Comments: Left shoulder injury/dislocated.  Don't pull on LUE. Restrictions Weight Bearing Restrictions: No   Pertinent Vitals/Pain No c/o pain, VSS    Mobility  Bed Mobility Bed Mobility: Not assessed (pt in chair) Transfers Transfers: Sit to Stand;Stand to Sit Sit to Stand: 4: Min assist;With upper extremity assist;From chair/3-in-1 Stand to Sit: 5: Supervision;To chair/3-in-1;With upper extremity assist Details for Transfer Assistance: pt attempted sit to stand from recliner 5x with supervision and could not get wt fwd enough to achieve full standing (though daughter reports that he did in earlier today). Min A given to pt's thoracic region to assist with wt-shift and pt able to achieve standing Ambulation/Gait Ambulation/Gait Assistance: 4: Min guard Ambulation Distance (Feet): 300 Feet Assistive device: Rolling walker Ambulation/Gait Assistance Details: initially, pt dragging right foot, after being cued, pt began stepping right effectively. As he fatigued with distance, geban to have trouble with right leg again.   No LOB with RW though.  Gait Pattern: Step-to pattern;Step-through pattern;Decreased stance time - right;Trunk flexed Gait velocity: Slow gait speed Stairs: No Wheelchair Mobility Wheelchair Mobility: No Modified Rankin (Stroke Patients Only) Pre-Morbid Rankin Score: Moderate disability Modified Rankin: Moderately severe disability    Exercises     PT Diagnosis:    PT Problem List:   PT Treatment Interventions:     PT Goals Acute Rehab PT Goals PT Goal Formulation: With patient Time For Goal Achievement: 09/20/12 Potential to Achieve Goals: Good Pt will go Sit to Stand: with modified independence PT Goal: Sit to Stand - Progress: Progressing toward goal Pt will go Stand to Sit: with modified independence PT Goal: Stand to Sit - Progress: Progressing toward goal Pt will Ambulate: 51 - 150 feet;with modified independence;with rolling walker PT Goal: Ambulate - Progress: Progressing toward goal Pt will Go Up / Down Stairs: 1-2 stairs;with min assist;with rail(s);with least restrictive assistive device PT Goal: Up/Down Stairs - Progress: Progressing toward goal  Visit Information  Last PT Received On: 09/14/12 Assistance Needed: +1    Subjective Data  Subjective: pt wants to get out of here today and really wants to get back to [facility] Patient Stated Goal: To go home   Cognition  Overall Cognitive Status: Appears within functional limits for tasks assessed/performed Arousal/Alertness: Awake/alert Orientation Level: Oriented X4 / Intact Behavior During Session: [facility] for tasks performed Cognition - Other Comments: expressive aphasia    Balance  Balance Balance Assessed: Yes Dynamic Standing Balance Dynamic Standing - Balance Support: Bilateral upper extremity supported;During functional activity Dynamic Standing - Level of Assistance: 5: Stand by assistance  End of Session PT - End of Session Equipment  Utilized During Treatment: Gait belt Activity Tolerance: Patient  tolerated treatment well Patient left: in chair;with call bell/phone within reach;with family/visitor present Nurse Communication: Mobility status   GP   Lyanne Co, PT  Acute Rehab Services  [phone]   Lyanne Co 09/14/2012, 4:55 PM

## 2012-09-14 NOTE — Consult Note (Signed)
Requested per RN to come and talk with patient and daughter to inspect and evaluate the glucometer they are using at home for accuracy.  I had patient demonstrate how he checks his glucose, and with limited dexterity in the right hand, it was difficult for him to put the strip in the meter without dropping it several times.  The strips in that particular bottle were disarranged indicated that they strips had fallen out and been put back in several times.  Pt states he usually jabs the lancet into his finger because he cannot get the blood to come out to get a glucose reading. Then the blood runs down his finger and it is still difficult to get the blood to draw up on the strip.  I demonstrated how to pump the finger, run it under warm water and shake it down as well as squeeze during the puncture to get an adequate drop of blood.  Although it took a minute of squeezing the finger after it was punctured, a drop of blood did appear and gave the meter a reading of 255 mg/dL. Within in 5 minutes the CNA checked with the floor meter using the same technique and got a glucose reading of 107 mg/dL.  There was no test solution to check the accuracy of the strips pt used at home, however his daughter gave me a new box of strips and he returned demonstrated the process and checked his glucose using the new strips.  This reading was at 105 mg/dl. I determined that the previous bottle had potentially been contaminated since the reading was so off and he had dropped the strips several times.  He also said it was difficult to to shut the cap on the bottle of strips tightly.  I explained how important that the strips stay in the bottle and the cap must be shut securely. I reviewed the procedure with the daughter who return demonstrated and voiced understanding. Regarding the insulin, the patient demonstrated how he operates the pen, and he admits that he was not aware of priming the needle/clearing the air before dialing up the  insulin. He stated that sometimes he is unsure if he even got any insulin into his injection site (stomach) and therefore will give him self the injection over again or part of it.  I reviewed the entire procedure of using the pen with the patient and the daughter and emphasized that patient must clear the needle with 2-3 units, then dial up the insulin, then inject and hold for 8-10 seconds.  Pt was unable to use one hand to inject and push the plunger, however, he seemed to be able to demonstrate that he could push the plunger with his other hand.  Pt's daughter states that patient's fasting glucose for the past 2 months is usually less than 100 and sometimes in the 70's.  The dose has been adjusted to 5 units, however, from my perspective, I do not think this patient needs to be on any lantus insulin at all.  I feel strongly that the danger of hypoglycemia is far more a danger than the potential for fasting hyperglycemia.  Pt has lived with his wife who had type 1 diabetes and is under the belief that he must have insulin to control his glucose.  He is extremely stubborn about how to carry out these procedures independently and becomes vocally hostile to his daughter when she tries to help him with anything. Pt is suppose to go back to  [place] soon to live on his own.  If in fact he does, I would request that he not take insulin living alone, not even 5 units.  However, this would be up to Dr Clovis Riley, his primary MD here in [facility].  I did recommend to the daughter that if his fasting cbg's continue to be less than 100 mg/dL., that she contact his MD to get the okay to discontinue the lantus.  Gave daughter my contact numbers to call 24 hrs a day.  Validated her action of calling the EMS when patient passed out, reviewed how to recognize and treat mild hyperglycemia, etc. Gave her materials regarding signs and symptoms of low glucose as well as high levels, treatments, etc.Gave her written materials on how to select  and prepare foods.   Thank you, Lenor Coffin, RN, CNS, Diabetes Coordinator [phone])

## 2012-09-14 NOTE — Consult Note (Signed)
[facility] CONSULT NOTE     Reason for Consult: possible syncope    HPI:    Douglas May is an 78 y.o. male with history of remote left occipital hemorrhagic infarct, HTN, DM, hypercholesterolemia and CAD.  Patient lives with his daughter while he is currently going through neurorehab as a out patient.  He awoke on Saturday morning and was making eggs.  Daughter (who is chief historian) states her father told her he did not feels well.  He sat down and continued to state he did not feel well. HE could not give more explicit information.  At one point he did say "I think I am going blind". Daughter called EMS.  While on the phone she noted he was not answering questions and arms were held floppy at both sides.  No TC activity was noted and no labored breathing.  He did not fall to the ground but stayed upright. When EMS arrived he was alert and oriented joking with EMS crew,  His BG was 98. He was brought to the hospital and has had no further episodes.   Past Medical History  Diagnosis Date  . Stroke   . Diabetes mellitus without complication   . CAD (coronary artery disease)   . Hypertension   . High cholesterol   . TIA (transient ischemic attack)     Past Surgical History  Procedure Date  . Carotid endarterectomy   . Coronary artery bypass graft     History reviewed. No pertinent family history.  Social History:  reports that he has quit smoking. His smoking use included Cigarettes. He smoked 1 pack per day. He does not have any smokeless tobacco history on file. He reports that he does not drink alcohol or use illicit drugs.  Allergies  Allergen Reactions  . Morphine And Related Itching  . Penicillins Other (See Comments)    unknown    Medications:    Prior to Admission:  Prescriptions prior to admission  Medication Sig Dispense Refill  . acetaminophen (TYLENOL) 650 MG CR tablet Take 650 mg by mouth every 4 (four) hours as needed.  For pain      . aspirin 81 MG tablet Take 81 mg by mouth daily.      [name] atorvastatin (LIPITOR) 40 MG tablet Take 40 mg by mouth daily.      [name] CALCIUM-VITAMIN D PO Take 1 tablet by mouth daily.      [name] docusate sodium (COLACE) 100 MG capsule Take 100 mg by mouth 2 (two) times daily.      . ferrous sulfate 325 (65 FE) MG tablet Take 325 mg by mouth daily with breakfast.      . insulin glargine (LANTUS) 100 UNIT/ML injection Inject 10 Units into the skin at bedtime.      . irbesartan (AVAPRO) 75 MG tablet Take 75 mg by mouth at bedtime.      . metoprolol succinate (TOPROL-XL) 50 MG 24 hr tablet Take 50 mg by mouth daily. Take with or immediately following a meal.      . Multiple Vitamin (MULTIVITAMIN WITH MINERALS) TABS Take 1 tablet by mouth daily.      . Multiple Vitamins-Minerals (PRESERVISION AREDS PO) Take 1 capsule by mouth 2 (two) times daily.      . naproxen (NAPROSYN) 500 MG tablet Take 500 mg by mouth daily as  needed.      . nitroGLYCERIN (NITROSTAT) 0.4 MG SL tablet Place 0.4 mg under the tongue every 5 (five) minutes as needed. Chest pain      . polyethylene glycol (MIRALAX / GLYCOLAX) packet Take 17 g by mouth daily as needed. constipation       Scheduled:   . aspirin EC  325 mg Oral Daily  . atorvastatin  40 mg Oral q1800  . docusate sodium  100 mg Oral BID  . ferrous sulfate  325 mg Oral Q breakfast  . heparin  5,000 Units Subcutaneous Q8H  . irbesartan  75 mg Oral QHS  . metoprolol succinate  50 mg Oral Daily  . multivitamin with minerals  1 tablet Oral Daily  . potassium chloride  40 mEq Oral Once  . senna  1 tablet Oral BID  . sodium chloride  3 mL Intravenous Q12H  . DISCONTD: insulin aspart  0-5 Units Subcutaneous QHS  . DISCONTD: insulin aspart  0-9 Units Subcutaneous TID WC  . DISCONTD: insulin aspart  3 Units Subcutaneous TID WC  . DISCONTD: insulin glargine  5 Units Subcutaneous QHS    Review of Systems - General ROS: negative for - chills, fatigue, fever or hot  flashes Hematological and Lymphatic ROS: negative for - bruising, fatigue, jaundice or pallor Endocrine ROS: negative for - hair pattern changes, hot flashes, mood swings or skin changes Respiratory ROS: negative for - cough, hemoptysis, orthopnea or wheezing Cardiovascular ROS: negative for - dyspnea on exertion, orthopnea, palpitations or shortness of breath Gastrointestinal ROS: negative for - abdominal pain, appetite loss, blood in stools, diarrhea or hematemesis Musculoskeletal ROS: negative for - joint pain, joint stiffness, joint swelling or muscle pain Neurological ROS: positive for - syncope Dermatological ROS: negative for dry skin, pruritus and rash   Blood pressure 174/66, pulse 60, temperature 98.1 F (36.7 C), temperature source Oral, resp. rate 16, height 5\' 2"  (1.575 m), weight 66.724 kg (147 lb 1.6 oz), SpO2 98.00%.   Neurologic Examination:   Mental Status: Alert, oriented, thought content appropriate.  Speech fluent without evidence of aphasia.  Able to follow 3 step commands without difficulty. Cranial Nerves: II:  Visual fields grossly normal able to count fingers in all fields, pupils equal, round, reactive to light and accommodation III,IV, VI: ptosis not present, extra-ocular motions intact bilaterally V,VII: smile asymmetric on left, facial light touch sensation normal bilaterally VIII: hearing decreased bilaterally (baseline) IX,X: uvula rises bilaterally XI: bilateral shoulder shrug XII: midline tongue extension Motor: Right : Upper extremity   4/5    Left:     Upper extremity   4-/5 (decreased motion due to previous injury and arthritis)  Lower extremity   4/5     Lower extremity   4/5 Tone and bulk:normal tone throughout; no atrophy noted Sensory: Pinprick and light touch stated to be intact throughout, bilaterally Deep Tendon Reflexes: 2+ and symmetric throughout UE, 2+ bilateral KJ, 2+ right AJ and no left AJ Plantars: Right: mute  Left:  mute Cerebellar: normal finger-to-nose limited but no dysmetria noted,  normal heel-to-shin test CV: pulses palpable throughout     Lab Results  Component Value Date/Time   CHOL 96 09/13/2012  7:13 AM    Results for orders placed during the hospital encounter of 09/12/12 (from the past 48 hour(s))  GLUCOSE, CAPILLARY     Status: Abnormal   Collection Time   09/12/12  5:42 PM      Component Value Range Comment  Glucose-Capillary 226 (*) 70 - 99 mg/dL    Comment 1 Notify RN     GLUCOSE, CAPILLARY     Status: Abnormal   Collection Time   09/12/12  8:19 PM      Component Value Range Comment   Glucose-Capillary 54 (*) 70 - 99 mg/dL   GLUCOSE, CAPILLARY     Status: Abnormal   Collection Time   09/12/12  9:22 PM      Component Value Range Comment   Glucose-Capillary 139 (*) 70 - 99 mg/dL   HEMOGLOBIN N5A     Status: Abnormal   Collection Time   09/12/12 10:04 PM      Component Value Range Comment   Hemoglobin A1C 6.1 (*) <5.7 %    Mean Plasma Glucose 128 (*) <117 mg/dL   CBC     Status: Abnormal   Collection Time   09/12/12 10:04 PM      Component Value Range Comment   WBC 9.0  4.0 - 10.5 K/uL    RBC 3.46 (*) 4.22 - 5.81 MIL/uL    Hemoglobin 11.0 (*) 13.0 - 17.0 g/dL    HCT 21.3 (*) 08.6 - 52.0 %    MCV 97.1  78.0 - 100.0 fL    MCH 31.8  26.0 - 34.0 pg    MCHC 32.7  30.0 - 36.0 g/dL    RDW 57.8  46.9 - 62.9 %    Platelets 248  150 - 400 K/uL   CREATININE, SERUM     Status: Abnormal   Collection Time   09/12/12 10:04 PM      Component Value Range Comment   Creatinine, Ser 1.35  0.50 - 1.35 mg/dL    GFR calc non Af Amer 46 (*) >90 mL/min    GFR calc Af Amer 53 (*) >90 mL/min   TSH     Status: Normal   Collection Time   09/12/12 10:04 PM      Component Value Range Comment   TSH 1.417  0.350 - 4.500 uIU/mL   FOLATE RBC     Status: Abnormal   Collection Time   09/12/12 10:04 PM      Component Value Range Comment   RBC Folate 1548 (*) >=366 ng/mL Reference range not  established for pediatric patients.  VITAMIN B12     Status: Normal   Collection Time   09/12/12 10:04 PM      Component Value Range Comment   Vitamin B-12 373  211 - 911 pg/mL   TROPONIN I     Status: Normal   Collection Time   09/12/12 10:18 PM      Component Value Range Comment   Troponin I <0.30  <0.30 ng/mL   URINE RAPID DRUG SCREEN (HOSP PERFORMED)     Status: Normal   Collection Time   09/13/12  6:16 AM      Component Value Range Comment   Opiates NONE DETECTED  NONE DETECTED    Cocaine NONE DETECTED  NONE DETECTED    Benzodiazepines NONE DETECTED  NONE DETECTED    Amphetamines NONE DETECTED  NONE DETECTED    Tetrahydrocannabinol NONE DETECTED  NONE DETECTED    Barbiturates NONE DETECTED  NONE DETECTED   TROPONIN I     Status: Normal   Collection Time   09/13/12  7:13 AM      Component Value Range Comment   Troponin I <0.30  <0.30 ng/mL   COMPREHENSIVE METABOLIC PANEL  Status: Abnormal   Collection Time   09/13/12  7:13 AM      Component Value Range Comment   Sodium 142  135 - 145 mEq/L    Potassium 4.3  3.5 - 5.1 mEq/L DELTA CHECK NOTED   Chloride 108  96 - 112 mEq/L    CO2 27  19 - 32 mEq/L    Glucose, Bld 119 (*) 70 - 99 mg/dL    BUN 30 (*) 6 - 23 mg/dL    Creatinine, Ser 4.09  0.50 - 1.35 mg/dL    Calcium 9.1  8.4 - 81.1 mg/dL    Total Protein 5.2 (*) 6.0 - 8.3 g/dL    Albumin 3.2 (*) 3.5 - 5.2 g/dL    AST 15  0 - 37 U/L    ALT 10  0 - 53 U/L    Alkaline Phosphatase 55  39 - 117 U/L    Total Bilirubin 0.3  0.3 - 1.2 mg/dL    GFR calc non Af Amer 46 (*) >90 mL/min    GFR calc Af Amer 53 (*) >90 mL/min   CBC     Status: Abnormal   Collection Time   09/13/12  7:13 AM      Component Value Range Comment   WBC 7.5  4.0 - 10.5 K/uL    RBC 3.33 (*) 4.22 - 5.81 MIL/uL    Hemoglobin 10.4 (*) 13.0 - 17.0 g/dL    HCT 91.4 (*) 78.2 - 52.0 %    MCV 96.4  78.0 - 100.0 fL    MCH 31.2  26.0 - 34.0 pg    MCHC 32.4  30.0 - 36.0 g/dL    RDW 95.6  21.3 - 08.6 %     Platelets 221  150 - 400 K/uL   LIPID PANEL     Status: Normal   Collection Time   09/13/12  7:13 AM      Component Value Range Comment   Cholesterol 96  0 - 200 mg/dL    Triglycerides 82  <578 mg/dL    HDL 40  >46 mg/dL    Total CHOL/HDL Ratio 2.4      VLDL 16  0 - 40 mg/dL    LDL Cholesterol 40  0 - 99 mg/dL   GLUCOSE, CAPILLARY     Status: Abnormal   Collection Time   09/13/12  7:48 AM      Component Value Range Comment   Glucose-Capillary 130 (*) 70 - 99 mg/dL    Comment 1 Notify RN     GLUCOSE, CAPILLARY     Status: Abnormal   Collection Time   09/13/12 11:31 AM      Component Value Range Comment   Glucose-Capillary 100 (*) 70 - 99 mg/dL    Comment 1 Notify RN     GLUCOSE, CAPILLARY     Status: Abnormal   Collection Time   09/13/12  4:59 PM      Component Value Range Comment   Glucose-Capillary 107 (*) 70 - 99 mg/dL    Comment 1 Notify RN     GLUCOSE, CAPILLARY     Status: Abnormal   Collection Time   09/13/12  8:32 PM      Component Value Range Comment   Glucose-Capillary 140 (*) 70 - 99 mg/dL   CBC     Status: Abnormal   Collection Time   09/14/12  5:36 AM      Component Value Range Comment  WBC 6.3  4.0 - 10.5 K/uL    RBC 2.88 (*) 4.22 - 5.81 MIL/uL    Hemoglobin 9.1 (*) 13.0 - 17.0 g/dL    HCT 10.2 (*) 72.5 - 52.0 %    MCV 96.2  78.0 - 100.0 fL    MCH 31.6  26.0 - 34.0 pg    MCHC 32.9  30.0 - 36.0 g/dL    RDW 36.6  44.0 - 34.7 %    Platelets 199  150 - 400 K/uL   BASIC METABOLIC PANEL     Status: Abnormal   Collection Time   09/14/12  5:36 AM      Component Value Range Comment   Sodium 146 (*) 135 - 145 mEq/L    Potassium 3.2 (*) 3.5 - 5.1 mEq/L DELTA CHECK NOTED   Chloride 115 (*) 96 - 112 mEq/L    CO2 23  19 - 32 mEq/L    Glucose, Bld 86  70 - 99 mg/dL    BUN 21  6 - 23 mg/dL    Creatinine, Ser 4.25  0.50 - 1.35 mg/dL    Calcium 7.6 (*) 8.4 - 10.5 mg/dL    GFR calc non Af Amer 72 (*) >90 mL/min    GFR calc Af Amer 83 (*) >90 mL/min   GLUCOSE,  CAPILLARY     Status: Abnormal   Collection Time   09/14/12  7:19 AM      Component Value Range Comment   Glucose-Capillary 113 (*) 70 - 99 mg/dL    Comment 1 Notify RN     GLUCOSE, CAPILLARY     Status: Abnormal   Collection Time   09/14/12 11:39 AM      Component Value Range Comment   Glucose-Capillary 107 (*) 70 - 99 mg/dL    Comment 1 Notify RN       Mri Brain Without Contrast  09/13/2012  *RADIOLOGY REPORT*  Clinical Data:  Syncopal episode yesterday morning.  Rule out stroke.  MRI HEAD WITHOUT CONTRAST MRA HEAD WITHOUT CONTRAST  Technique:  Multiplanar, multiecho pulse sequences of the brain and surrounding structures were obtained without intravenous contrast. Angiographic images of the head were obtained using MRA technique without contrast.  Comparison:  CT head without contrast 09/12/2012.  MRI brain 07/18/2012.  MRI HEAD  Findings:  The diffusion weighted images demonstrate no evidence for acute or subacute infarction.  A remote hemorrhagic infarction of the left occipital lobe is stable.  No acute hemorrhage or mass lesion is present.  Moderate generalized atrophy is present.  Extensive confluent periventricular and subcortical white matter disease is present bilaterally.  Remote lacunar infarction noted at right caudate head and anterior right frontal white matter.  White matter changes extend into the left central pons as previously seen.  Flow is present in the major intracranial arteries.  The patient is status post right lens extraction.  Mild mucosal thickening is present in the left frontal sinus.  The paranasal sinuses are otherwise clear.  The mastoid air cells are clear.  IMPRESSION:  1.  No acute intracranial abnormality or significant interval change. 2.  Remote left occipital hemorrhagic infarct. 3.  Stable atrophy and extensive white matter disease.    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  There is mild atherosclerotic  irregularity within the cavernous carotid arteries bilaterally, worse on the left.  No focal stenosis is evident.  The A1 and M1 segments are normal.  The  MCA bifurcations are within normal limits.  Mild to moderate attenuation of MCA branch vessels is present without significant proximal stenosis or occlusion.  The right vertebral artery is the dominant vessel.  Dominant AICA vessels are present bilaterally.  Both posterior cerebral arteries originate from a normal basilar artery.  Asymmetric irregularities noted within the proximal left posterior cerebral artery without a significant stenosis of greater than 50%.  There is attenuation of distal PCA branch vessels as well.  IMPRESSION:  1.  Mild to moderate small vessel disease. 2.  More proximal atherosclerotic changes in the left posterior cerebral artery on the left. 3.  Mild irregularity within the left cavernous carotid artery without significant stenosis. 4.  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   Original Report Authenticated By: Jamesetta Orleans. MATTERN, M.D.      LDL 40 A1c 6.1   Assessment/Plan:   51 male who experienced a syncopal episode preceded by difficulty with vision.  Initial labs/diagnostic test showed elevated BUN:Cr,  no acute stroke on MRI and no significant stenosis on MRA. Carotid dopplers show  no right  ICA stenosis and left 40-59% stenosis. Exam shows residual left facial droop and right sided weakness otherwise non focal.  Echocardiogram is pending.  Differential for his syncopal event includes TIA, arthymia vs seizure.  Stroke has not been noted on imaging.    Recommend:   1) Out patient Holter monitor 2) Continue ASA daily 3) EEG.  May be performed as an outpatient  Felicie Morn PA-C [facility] [phone]  09/14/2012, 3:02 PM    Patient seen and examined.  Clinical course and management discussed.  Necessary edits performed.  I agree with the above.  Thana Farr, MD [facility] [phone]  09/14/2012  5:12 PM

## 2012-09-15 ENCOUNTER — Ambulatory Visit (HOSPITAL_COMMUNITY): Payer: Medicare PPO

## 2012-09-15 ENCOUNTER — Ambulatory Visit: Payer: Medicare PPO | Admitting: Physical Therapy

## 2012-09-15 ENCOUNTER — Ambulatory Visit: Payer: Medicare PPO

## 2012-09-15 ENCOUNTER — Encounter: Payer: Medicare PPO | Admitting: Occupational Therapy

## 2012-09-15 NOTE — Progress Notes (Signed)
Utilization review completed- retro 

## 2012-09-17 ENCOUNTER — Ambulatory Visit: Payer: Medicare PPO | Admitting: Physical Therapy

## 2012-09-17 ENCOUNTER — Ambulatory Visit: Payer: Medicare PPO | Admitting: Occupational Therapy

## 2012-09-17 ENCOUNTER — Ambulatory Visit: Payer: Medicare PPO

## 2012-09-22 ENCOUNTER — Ambulatory Visit: Payer: Medicare PPO | Admitting: Physical Therapy

## 2012-09-22 ENCOUNTER — Ambulatory Visit: Payer: Medicare PPO | Admitting: *Deleted

## 2012-09-23 ENCOUNTER — Ambulatory Visit: Payer: Medicare PPO | Admitting: Rehabilitative and Restorative Service Providers"

## 2012-09-23 ENCOUNTER — Ambulatory Visit: Payer: Medicare PPO | Admitting: *Deleted

## 2012-09-29 ENCOUNTER — Ambulatory Visit: Payer: Medicare PPO | Admitting: Occupational Therapy

## 2012-09-29 ENCOUNTER — Ambulatory Visit: Payer: Medicare PPO | Admitting: Physical Therapy

## 2012-10-01 ENCOUNTER — Ambulatory Visit: Payer: Medicare PPO | Admitting: Occupational Therapy

## 2012-10-01 ENCOUNTER — Ambulatory Visit: Payer: Medicare PPO | Admitting: Physical Therapy

## 2012-10-05 ENCOUNTER — Ambulatory Visit: Payer: Medicare PPO | Attending: Family Medicine | Admitting: Physical Therapy

## 2012-10-05 DIAGNOSIS — R269 Unspecified abnormalities of gait and mobility: Secondary | ICD-10-CM | POA: Insufficient documentation

## 2012-10-05 DIAGNOSIS — I69998 Other sequelae following unspecified cerebrovascular disease: Secondary | ICD-10-CM | POA: Insufficient documentation

## 2012-10-05 DIAGNOSIS — IMO0001 Reserved for inherently not codable concepts without codable children: Secondary | ICD-10-CM | POA: Insufficient documentation

## 2012-10-05 DIAGNOSIS — M2560 Stiffness of unspecified joint, not elsewhere classified: Secondary | ICD-10-CM | POA: Insufficient documentation

## 2012-10-05 DIAGNOSIS — R5381 Other malaise: Secondary | ICD-10-CM | POA: Insufficient documentation

## 2012-10-05 DIAGNOSIS — M6281 Muscle weakness (generalized): Secondary | ICD-10-CM | POA: Insufficient documentation

## 2012-10-07 ENCOUNTER — Ambulatory Visit: Payer: Medicare PPO | Admitting: Physical Therapy

## 2012-10-13 ENCOUNTER — Ambulatory Visit: Payer: Medicare PPO | Admitting: Physical Therapy

## 2012-10-15 ENCOUNTER — Ambulatory Visit: Payer: Medicare PPO | Admitting: Physical Therapy

## 2012-11-05 ENCOUNTER — Ambulatory Visit (INDEPENDENT_AMBULATORY_CARE_PROVIDER_SITE_OTHER): Payer: Medicare Other | Admitting: FAMILY PRACTICE

## 2012-11-05 ENCOUNTER — Encounter (INDEPENDENT_AMBULATORY_CARE_PROVIDER_SITE_OTHER): Payer: Self-pay | Admitting: FAMILY PRACTICE

## 2012-11-05 VITALS — BP 140/64 | HR 76 | Temp 98.2°F | Resp 18 | Ht 65.0 in | Wt 146.0 lb

## 2012-11-06 ENCOUNTER — Encounter (INDEPENDENT_AMBULATORY_CARE_PROVIDER_SITE_OTHER): Payer: Self-pay | Admitting: FAMILY PRACTICE

## 2012-11-09 ENCOUNTER — Other Ambulatory Visit (INDEPENDENT_AMBULATORY_CARE_PROVIDER_SITE_OTHER): Payer: Self-pay

## 2012-11-09 MED ORDER — BLOOD SUGAR DIAGNOSTIC STRIPS
1.00 | ORAL_STRIP | Freq: Two times a day (BID) | Status: DC
Start: 2012-11-09 — End: 2013-08-23

## 2012-11-09 MED ORDER — LANCETS
1.00 [IU] | Freq: Two times a day (BID) | Status: AC
Start: 2012-11-09 — End: ?

## 2012-11-30 ENCOUNTER — Other Ambulatory Visit (INDEPENDENT_AMBULATORY_CARE_PROVIDER_SITE_OTHER): Payer: Self-pay

## 2012-11-30 MED ORDER — INSULIN GLARGINE (U-100) 100 UNIT/ML (3 ML) SUBCUTANEOUS PEN
5.00 [IU] | PEN_INJECTOR | Freq: Every evening | SUBCUTANEOUS | Status: DC
Start: 2012-11-30 — End: 2013-12-23

## 2012-12-08 ENCOUNTER — Telehealth (INDEPENDENT_AMBULATORY_CARE_PROVIDER_SITE_OTHER): Payer: Self-pay

## 2012-12-08 NOTE — Telephone Encounter (Signed)
 [name], OT with [facility] ,  calling, he will be D/C patient today from therapy. Training is complete. He has been working with him since early Dec.  He is doing well over all, bathroom self, meals for him self and use of the reacher to pick things up that he may drop on floor. His impairment with  right leg and hand con't to be high fall risk and balance issues remain present.  Thanks!

## 2012-12-17 ENCOUNTER — Encounter (INDEPENDENT_AMBULATORY_CARE_PROVIDER_SITE_OTHER): Payer: Self-pay | Admitting: FAMILY PRACTICE

## 2012-12-17 ENCOUNTER — Ambulatory Visit (INDEPENDENT_AMBULATORY_CARE_PROVIDER_SITE_OTHER): Payer: Medicare Other | Admitting: FAMILY PRACTICE

## 2012-12-17 VITALS — BP 138/68 | HR 91 | Temp 96.7°F | Resp 20 | Wt 149.0 lb

## 2012-12-17 MED ORDER — DIVALPROEX 125 MG CAPSULE,DELAYED RELEASE SPRINKLE
125.00 mg | DELAYED_RELEASE_CAPSULE | Freq: Two times a day (BID) | ORAL | Status: DC
Start: 2012-12-17 — End: 2013-01-28

## 2012-12-18 NOTE — Progress Notes (Signed)
 Subjective:     Patient ID:  Harry Rowland is an 79 y.o. male     Chief Complaint:    Chief Complaint   Patient presents with   . Other     F/U   . Fall     Last night       HPI  Harry Rowland, a 79 y/o male, presents to clinic to f/u with home health/htn with Daughter.  Pt just moved back to his apartment/home from [place] .  Daughter stayed with him for approximately 1 month to get caregivers and home health established.  He has been doing well at home, but more argumentative.  Wants to do everything himself and doesn't understand why he cant.  Daughter states that he fell last night because he tried to throw his walker at her over an argument.  Fell on his right side, no tenderness or bruising.  Was able to get up with help.  Did not hit his head.  Does complain of chronic back pain without worsening.    Taking fingersticks , low 80's--high 180-190.  Average 120-140.  Has meals fixed for him but has been eating peanut butter cookies that were suppose to be daily treat.  (has eaten 3 dozen in 2 weeks.)      Review of Systems   Constitutional: Negative for fever, chills and weight loss.   HENT: Negative for congestion and sore throat.    Eyes: Negative for blurred vision.   Respiratory: Negative for cough and shortness of breath.    Cardiovascular: Negative for chest pain and leg swelling.   Gastrointestinal: Negative for heartburn, nausea, vomiting and abdominal pain.   Genitourinary: Negative for dysuria.   Musculoskeletal: Positive for back pain and joint pain.   Skin: Negative for rash.   Neurological: Negative for dizziness and headaches.        Unbalanced   Psychiatric/Behavioral: Negative for depression.        Angry about lack of independence.       Objective:   BP 138/68  Pulse 91  Temp(Src) 35.9 C (96.7 F) (Tympanic)  Resp 20  Wt 67.586 kg (149 lb)  BMI 24.79 kg/m2  SpO2 98%    Physical Exam   Nursing note and vitals reviewed.  Constitutional: He is oriented to person, place, and time  and well-developed, well-nourished, and in no distress.   HENT:   Head: Normocephalic and atraumatic.   Eyes: Conjunctivae are normal.   Neck: Normal range of motion.   Cardiovascular: Normal rate.    Pulmonary/Chest: Effort normal. No respiratory distress.   Abdominal: Soft. Bowel sounds are normal. He exhibits no distension. There is no tenderness.   Musculoskeletal: He exhibits no edema.   Neurological: He is alert and oriented to person, place, and time.   Skin: Skin is warm and dry. No erythema.   Psychiatric: Affect normal.       Ortho/Musculoskeletal:   He exhibits no edema.     Current Outpatient Prescriptions   Medication Sig   . acetaminophen (TYLENOL) 325 mg Oral Tablet Take 650 mg by mouth Every 4 hours as needed   . Blood Sugar Diagnostic (ASCENSIA CONTOUR) Does not apply Strip 1 Strip by Does not apply route Twice daily   . CALCIUM CARBONATE/VITAMIN D3 (CALCARB 600 WITH VITAMIN D  ORAL) Take by mouth   . divalproex  (DEPAKOTE ) 125 mg Oral Capsule, Sprinkle Take 1 Cap (125 mg total) by mouth Twice daily   . ferrous sulfate  (FERATAB)  324 mg (65 mg iron ) Oral Tablet, Delayed Release (E.C.) Take 324 mg by mouth Twice daily   . hydrocortisone (CORTIZONE-10) 1 % Apply externally Cream by Apply Topically route Twice per day as needed   . insulin  glargine (LANTUS  SOLOSTAR) 100 unit/mL Subcutaneous Insulin  Pen 5 Units by Subcutaneous route Every night   . Lancets Does not apply Misc 1 Units by Does not apply route Twice daily   . MULTIVITAMINS WITH FLUORIDE (MULTI-VITAMIN PO) Take by mouth   . naproxen (NAPROSYN) 500 mg Oral Tablet Take 500 mg by mouth Once a day   . nitroglycerin  (NITROSTAT ) 0.4 mg Sublingual Tablet, Sublingual 0.4 mg by Sublingual route Every 5 minutes as needed for 3 doses over 15 minutes   . polyethylene glycol (MIRALAX) 17 gram Oral Powder in Packet Take 17 g by mouth Once a day   . VIT A/VIT C/VIT E/ZINC/COPPER (PRESERVISION AREDS ORAL) Take by mouth       Assessment & Plan:     1. Mood  disorder (296.90)  Add divalproex  (DEPAKOTE ) 125 mg Oral Capsule, Sprinkle once daily x 2 weeks then increase to BID.  F/u 1 month.     2. Diabetes mellitus (250.00)  Continue Lantus  5 units.  Pt refusing to stop medication.  Will repeat hga1c at next appt.       3. Hypertension (401.9)  Continue current medication.  Check chem 14, cbc, lipid panel 1 month.     4. 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  Counseled > 30 min on the necessity of daily caregivers to help with certain tasks so he can continue to live independently.     5. Carotid Artery Disease (433.11)  Check lipid 1 month.  Continue current medications.       Return in about 1 month (around 01/18/2013), or if symptoms worsen or fail to improve, for mood f/u.

## 2013-01-04 ENCOUNTER — Other Ambulatory Visit (INDEPENDENT_AMBULATORY_CARE_PROVIDER_SITE_OTHER): Payer: Self-pay

## 2013-01-04 MED ORDER — IRBESARTAN 75 MG TABLET
75.0000 mg | ORAL_TABLET | Freq: Every day | ORAL | Status: DC
Start: 2013-01-04 — End: 2013-06-24

## 2013-01-18 ENCOUNTER — Other Ambulatory Visit (INDEPENDENT_AMBULATORY_CARE_PROVIDER_SITE_OTHER): Payer: Self-pay

## 2013-01-18 MED ORDER — ATORVASTATIN 40 MG TABLET
40.00 mg | ORAL_TABLET | Freq: Every evening | ORAL | Status: DC
Start: 2013-01-18 — End: 2013-12-30

## 2013-01-18 MED ORDER — FERROUS SULFATE 324 MG (65 MG IRON) TABLET,DELAYED RELEASE
324.0000 mg | DELAYED_RELEASE_TABLET | Freq: Two times a day (BID) | ORAL | Status: DC
Start: 2013-01-18 — End: 2014-07-10

## 2013-01-22 ENCOUNTER — Encounter (INDEPENDENT_AMBULATORY_CARE_PROVIDER_SITE_OTHER): Payer: Self-pay | Admitting: FAMILY PRACTICE

## 2013-01-28 ENCOUNTER — Ambulatory Visit (INDEPENDENT_AMBULATORY_CARE_PROVIDER_SITE_OTHER): Payer: Medicare Other | Admitting: FAMILY PRACTICE

## 2013-01-28 ENCOUNTER — Encounter (INDEPENDENT_AMBULATORY_CARE_PROVIDER_SITE_OTHER): Payer: Self-pay | Admitting: FAMILY PRACTICE

## 2013-01-28 VITALS — BP 130/78 | HR 77 | Temp 97.0°F | Resp 20 | Ht 63.0 in | Wt 151.0 lb

## 2013-01-28 NOTE — Progress Notes (Signed)
 Subjective:     Patient ID:  Harry Rowland is an 79 y.o. male     Chief Complaint:    Chief Complaint   Patient presents with   . Other     F/U       HPI  Harry Rowland, a pleasant 79 y/o male, presents to clinic with daughter for mood follow up.  Did not increase Depakote  to BID.  Only taking at night, because does not like caregiver that comes in the morning and daughter was afraid that he would give her a hard time if she tried to increase the medication.  She has not noticed any change in his mood.  Still getting upset and wanting to do everything himself and combative.  Has occasional headaches and sometimes he can't sleep.  He was getting up in the middle of night and doing laundry because he gets bored of staring at ceiling.  He is upset that he is unable to go outside due to weather.  Wants to get out and socialize.      No falls since last appt.  Gets along with evening care giver, trouble with the morning one because she is older and he likes young people.      Review of Systems   Constitutional: Negative for fever and chills.   HENT: Negative for congestion and sore throat.    Eyes: Negative for blurred vision.   Respiratory: Negative for cough and shortness of breath.    Cardiovascular: Negative for chest pain and leg swelling.   Gastrointestinal: Negative for heartburn, nausea, vomiting, abdominal pain and diarrhea.   Genitourinary: Negative for dysuria.   Musculoskeletal: Positive for joint pain. Negative for myalgias and falls.   Skin: Negative for rash.   Neurological: Positive for headaches. Negative for dizziness.   Psychiatric/Behavioral: Positive for depression. The patient has insomnia. The patient is not nervous/anxious.      Past Medical History   Diagnosis Date   . CAD (coronary artery disease)    . Heart attack      1970   . Stroke      2013     Past Surgical History   Procedure Laterality Date   . Hx ankle fracture tx     . Hx wrist fracture tx     . Hx heart surgery       double by  pass   . Hx carotid endarterectomy     . Hx coronary artery bypass graft     . Hx tonsillectomy     . Hx appendectomy       Family History   Problem Relation Age of Onset   . Blood Clots Mother    . Diabetes Father    . Hypertension Father    . Stroke Father    . Cancer Sister      BREAST   . Diabetes Sister    . Arthritis-osteo Sister    . Hypertension Sister    . Diabetes Paternal Aunt    . Stroke Maternal Grandmother        History     Social History   . Marital Status: Widowed     Spouse Name: N/A     Number of Children: N/A   . Years of Education: N/A     Social History Main Topics   . Smoking status: Former Games developer   . Smokeless tobacco: No   . Alcohol Use: No   . Drug Use: No   .  Sexually Active: No       Objective:   BP 130/78  Pulse 77  Temp(Src) 36.1 C (97 F) (Tympanic)  Resp 20  Ht 1.6 m (5' 3)  Wt 68.493 kg (151 lb)  BMI 26.76 kg/m2  SpO2 98%    Physical Exam   Nursing note and vitals reviewed.  Constitutional: He is oriented to person, place, and time and well-developed, well-nourished, and in no distress.   HENT:   Head: Normocephalic and atraumatic.   Eyes: Conjunctivae are normal.   Neck: Normal range of motion. Neck supple.   Cardiovascular: Normal rate and regular rhythm.    Pulmonary/Chest: Effort normal and breath sounds normal. No respiratory distress. He has no wheezes.   Abdominal: Soft. Bowel sounds are normal. He exhibits no distension. There is no tenderness.   Musculoskeletal: He exhibits no edema.   Neurological: He is alert and oriented to person, place, and time.   Skin: Skin is warm and dry.   Psychiatric:   Frustrated during visit with daughter       Ortho/Musculoskeletal:   He exhibits no edema.     Current Outpatient Prescriptions   Medication Sig   . acetaminophen (TYLENOL) 325 mg Oral Tablet Take 650 mg by mouth Every 4 hours as needed   . atorvastatin  (LIPITOR) 40 mg Oral Tablet Take 1 Tab (40 mg total) by mouth Every night   . Blood Sugar Diagnostic (ASCENSIA CONTOUR)  Does not apply Strip 1 Strip by Does not apply route Twice daily   . CALCIUM CARBONATE/VITAMIN D3 (CALCARB 600 WITH VITAMIN D  ORAL) Take by mouth   . ferrous sulfate  (FERATAB) 324 mg (65 mg iron ) Oral Tablet, Delayed Release (E.C.) Take 1 Tab (324 mg total) by mouth Twice daily   . hydrocortisone (CORTIZONE-10) 1 % Apply externally Cream by Apply Topically route Twice per day as needed   . insulin  glargine (LANTUS  SOLOSTAR) 100 unit/mL Subcutaneous Insulin  Pen 5 Units by Subcutaneous route Every night   . Irbesartan  (AVAPRO ) 75 mg Oral Tablet Take 1 Tab (75 mg total) by mouth Once a day   . Lancets Does not apply Misc 1 Units by Does not apply route Twice daily   . MULTIVITAMINS WITH FLUORIDE (MULTI-VITAMIN PO) Take by mouth   . naproxen (NAPROSYN) 500 mg Oral Tablet Take 500 mg by mouth Once a day   . nitroglycerin  (NITROSTAT ) 0.4 mg Sublingual Tablet, Sublingual 0.4 mg by Sublingual route Every 5 minutes as needed for 3 doses over 15 minutes   . polyethylene glycol (MIRALAX) 17 gram Oral Powder in Packet Take 17 g by mouth Once a day   . VIT A/VIT C/VIT E/ZINC/COPPER (PRESERVISION AREDS ORAL) Take by mouth       Assessment & Plan:     1. Diabetes mellitus (250.00)  Continue Lantus  5 units and check POCT HGB A1C  Monitor fingersticks.     2. Hypertension (401.9)  Continue avapro , controlled.  Check COMPREHENSIVE METABOLIC PANEL, NON-FASTING, CBC/DIFF     3. Hyperlipidemia LDL goal <70 (272.4)  Continue lipitor  Check COMPREHENSIVE METABOLIC PANEL, NON-FASTING, LIPID PANEL     4. Mood disorder (296.90)  No improvement with Depakote  so will discontinue.  Daughter concerned about possible dizziness as side effect.  VALPROIC ACID  LEVEL, VITAMIN B12     5. Urinary incontinence (788.30)  POCT URINE DIPSTICK     6. Encounter for vitamin deficiency screening (V77.99)  VITAMIN D  25 HYDROXY     Will review [facility] records.  Is suppose to have annual carotid ultrasound follow up.    Return in about 3 months (around  04/27/2013), or if symptoms worsen or fail to improve, for 1 day for fasting lab work, then 3 months f/u mood/DM.    Tinnie Sexton, DO 01/28/2013, 12:50 PM

## 2013-01-29 ENCOUNTER — Ambulatory Visit (INDEPENDENT_AMBULATORY_CARE_PROVIDER_SITE_OTHER): Payer: Medicare Other | Admitting: Clinical Medical Laboratory

## 2013-01-29 LAB — VITAMIN B12: VITAMIN B12: 561 pg/mL (ref 180–914)

## 2013-01-29 LAB — COMPREHENSIVE METABOLIC PANEL, NON-FASTING
ALBUMIN: 3.9 gm/dL (ref 3.5–4.8)
ALKALINE PHOSPHATASE: 73 U/L (ref 20–130)
ALT (SGPT): 18 U/L (ref 4–36)
AST (SGOT): 23 U/L (ref 8–33)
BILIRUBIN, TOTAL: 0.9 mg/dL (ref 0.3–1.2)
BUN: 30 mg/dL — ABNORMAL HIGH (ref 8–20)
CALCIUM: 9.6 mg/dL (ref 8.9–10.3)
CARBON DIOXIDE: 26 mEq/L (ref 22–32)
CHLORIDE: 108 mEq/L (ref 101–111)
CREATININE: 1.3 mg/dL — ABNORMAL HIGH (ref 0.6–1.2)
ESTIMATED GLOMERULAR FILTRATION RATE: 56
GLUCOSE,NONFAST: 127 mg/dL — ABNORMAL HIGH (ref 70–110)
POTASSIUM: 4.9 meq/L (ref 3.6–5.1)
SODIUM: 142 meq/L (ref 136–144)
TOTAL PROTEIN: 6.1 gm/dL — ABNORMAL LOW (ref 6.4–8.3)

## 2013-01-29 LAB — VALPROIC ACID LEVEL: VALPROIC ACID: 12 ug/mL — ABNORMAL LOW (ref 50.0–100.0)

## 2013-01-29 LAB — LIPID PANEL
CHOLESTEROL: 119 mg/dL (ref 0–199)
HDL-CHOLESTEROL: 50 mg/dL (ref 29–71)
LDL CHOLESTEROL,DIRECT: 60 mg/dL (ref 0–99)
TRIGLYCERIDES: 64 mg/dL (ref 0–199)
VLDL (CALCULATED): 13 mg/dL (ref 0–50)

## 2013-01-29 LAB — CBC/DIFF
BASOPHILS: 0.7 %
BASOS ABS: 0.1 10*3/uL (ref 0.00–0.20)
EOS ABS: 0.1 10*3/uL (ref 0.0–0.5)
EOSINOPHIL: 1.7 %
HCT: 40.7 % (ref 38.9–50.5)
HGB: 13.7 g/dL (ref 13.4–17.3)
LYMPHOCYTES: 18.9 %
LYMPHS ABS: 1.6 10*3/uL (ref 0.8–3.2)
MCH: 32 pg (ref 27.9–33.1)
MCHC: 33.6 gm/dL (ref 32.8–36.0)
MCV: 95.3 fl — ABNORMAL HIGH (ref 82.4–95.0)
MONOCYTES: 7.5 %
MONOS ABS: 0.7 10*3/uL (ref 0.2–0.8)
MPV: 8.5 fl (ref 6.0–10.2)
PLATELET COUNT (AUTO): 244 10*3/uL (ref 140–440)
PMN ABS (AUTO): 6.1 10*3/uL — ABNORMAL HIGH (ref 1.6–5.5)
PMN'S: 71.2 %
RBC: 4.28 10*6/uL — ABNORMAL LOW (ref 4.40–5.68)
RDW: 14.1 % (ref 10.9–15.1)
WBC: 8.6 10^3/uL (ref 3.3–9.3)

## 2013-01-29 LAB — POCT URINE DIPSTICK
BILIRUBIN: NEGATIVE
BLOOD: NEGATIVE
GLUCOSE: NORMAL
KETONE: NEGATIVE
LEUKOCYTES: NEGATIVE
NITRITE: NEGATIVE
PH: 5
SPECIFIC GRAVITY: 1.015
UROBILINOGEN: 0.2

## 2013-01-29 NOTE — Progress Notes (Signed)
Labs only

## 2013-02-01 ENCOUNTER — Other Ambulatory Visit (INDEPENDENT_AMBULATORY_CARE_PROVIDER_SITE_OTHER): Payer: Self-pay | Admitting: FAMILY PRACTICE

## 2013-02-10 ENCOUNTER — Ambulatory Visit (INDEPENDENT_AMBULATORY_CARE_PROVIDER_SITE_OTHER): Payer: Medicare Other | Admitting: Clinical Medical Laboratory

## 2013-02-10 LAB — BASIC METABOLIC PANEL
BUN: 29 mg/dL — ABNORMAL HIGH (ref 8–20)
CALCIUM: 9.6 mg/dL (ref 8.9–10.3)
CARBON DIOXIDE: 26 meq/L (ref 22–32)
CHLORIDE: 106 mEq/L (ref 101–111)
CREATININE: 1.4 mg/dL — ABNORMAL HIGH (ref 0.6–1.2)
ESTIMATED GLOMERULAR FILTRATION RATE: 51
GLUCOSE,NONFAST: 123 mg/dL — ABNORMAL HIGH (ref 70–110)
POTASSIUM: 4.9 mEq/L (ref 3.6–5.1)
SODIUM: 141 meq/L (ref 136–144)

## 2013-02-10 LAB — POCT HGB A1C: POCT HGB A1C: 7.2 % — AB (ref 4–6)

## 2013-02-10 NOTE — Progress Notes (Signed)
Labs only

## 2013-03-04 ENCOUNTER — Other Ambulatory Visit (INDEPENDENT_AMBULATORY_CARE_PROVIDER_SITE_OTHER): Payer: Self-pay

## 2013-03-25 ENCOUNTER — Telehealth (INDEPENDENT_AMBULATORY_CARE_PROVIDER_SITE_OTHER): Payer: Self-pay

## 2013-03-25 NOTE — Telephone Encounter (Signed)
Daughter calling, she is leaving town tomorrow, PCNA that comes to take care of her dad noticed a red spot on the top part of his buttock, he does wipe aggressively . No skin break down. Is going to buy some OTC barrier . Is this ok, or should you see him?

## 2013-04-22 ENCOUNTER — Encounter (INDEPENDENT_AMBULATORY_CARE_PROVIDER_SITE_OTHER): Payer: Self-pay | Admitting: FAMILY PRACTICE

## 2013-04-22 ENCOUNTER — Ambulatory Visit (INDEPENDENT_AMBULATORY_CARE_PROVIDER_SITE_OTHER): Payer: Medicare Other | Admitting: FAMILY PRACTICE

## 2013-04-22 VITALS — BP 118/70 | HR 60 | Temp 97.0°F | Resp 18 | Ht 63.0 in | Wt 151.0 lb

## 2013-04-27 NOTE — Progress Notes (Signed)
 Subjective:     Patient ID:  Harry Rowland is an 79 y.o. male     Chief Complaint:    Chief Complaint   Patient presents with   . Follow Up Dm (Diabetes Mellitus)     Type 2   . Hypertension   . Headache     Rt. front side.   . Skin  Lesion     Penis and Buttocks   . Leg Swelling       HPI  Harry Rowland, a pleasant 79 y/o male, presents to clinic with his daughter for DM follow up.  Pt brings his fingersticks that run from 80-160; currently taking Lantus  5 units at night.  This was decreased from 10 units because history of hypoglycemia.  He is concerned because his recent HgA1C was not below 6.5 which [facility] years ago told him his goal should be.  He wants to increase the Lantus  back to 10 units.  Does not follow diabetic diet, drinks sodas, sweets, and other foods because he is a picky eater.  Still has caregiver 2 times a day for approximately 2 hours each visit.  He is getting out of the house more and riding the bus to various establishments.  His mood has improved since weather has enabled him to travel more.    He has had a recent decrease in his kidney function, noticed increased swelling in legs bilaterally.  Started wearing compression stockings that seems to help.  Does not understand salt intake, eats what he wants and unable to read labels and determine what he should decrease for food intake.  He has always followed with specialists in the past and daughter would prefer following with specialist before changing medications.    History of coronary artery disease and needs to reestablish with cardiologist in the area.  Use to follow at [facility] but unable to make trip.  Will only see physician in [place], refuses [facility] treatment.  Also needs to have repeat ultrasound for carotid artery stenosis.    History of pressure sore on buttocks last month that has resolved with OTC creams and increased movements.     Review of Systems   Constitutional: Negative for fever, chills, weight  loss and malaise/fatigue.   Eyes: Negative for blurred vision.   Respiratory: Negative for cough and shortness of breath.    Cardiovascular: Positive for leg swelling. Negative for chest pain.   Gastrointestinal: Negative for heartburn, nausea, vomiting, abdominal pain, diarrhea and constipation.   Genitourinary: Negative for dysuria.   Musculoskeletal: Positive for joint pain.   Skin: Negative for rash.   Neurological: Positive for headaches. Negative for dizziness and focal weakness.   Psychiatric/Behavioral: Negative for depression. The patient does not have insomnia.        Objective:   BP 118/70  Pulse 60  Temp(Src) 36.1 C (97 F) (Tympanic)  Resp 18  Ht 1.6 m (5' 3)  Wt 68.493 kg (151 lb)  BMI 26.76 kg/m2  SpO2 97%    Physical Exam   Nursing note and vitals reviewed.  Constitutional: He is oriented to person, place, and time and well-developed, well-nourished, and in no distress.   HENT:   Head: Normocephalic and atraumatic.   Eyes: Conjunctivae are normal.   Neck: Neck supple. No JVD present.   Cardiovascular: Normal rate.    Pulmonary/Chest: Effort normal and breath sounds normal. No respiratory distress. He has no wheezes.   Abdominal: Soft. Bowel sounds are normal. He exhibits  no distension. There is no tenderness.   Musculoskeletal: He exhibits edema.   Neurological: He is alert and oriented to person, place, and time.   Skin: Skin is warm and dry.   Psychiatric: Affect normal.         Ortho/Musculoskeletal:   He exhibits edema.       Current Outpatient Prescriptions   Medication Sig   . acetaminophen (TYLENOL) 325 mg Oral Tablet Take 650 mg by mouth Every 4 hours as needed   . atorvastatin  (LIPITOR) 40 mg Oral Tablet Take 1 Tab (40 mg total) by mouth Every night   . Blood Sugar Diagnostic (ASCENSIA CONTOUR) Does not apply Strip 1 Strip by Does not apply route Twice daily   . CALCIUM CARBONATE/VITAMIN D3 (CALCARB 600 WITH VITAMIN D  ORAL) Take by mouth   . ferrous sulfate  (FERATAB) 324 mg (65 mg  iron ) Oral Tablet, Delayed Release (E.C.) Take 1 Tab (324 mg total) by mouth Twice daily   . hydrocortisone (CORTIZONE-10) 1 % Apply externally Cream by Apply Topically route Twice per day as needed   . insulin  glargine (LANTUS  SOLOSTAR) 100 unit/mL Subcutaneous Insulin  Pen 5 Units by Subcutaneous route Every night   . Irbesartan  (AVAPRO ) 75 mg Oral Tablet Take 1 Tab (75 mg total) by mouth Once a day   . Lancets Does not apply Misc 1 Units by Does not apply route Twice daily   . MULTIVITAMINS WITH FLUORIDE (MULTI-VITAMIN PO) Take by mouth   . naproxen (NAPROSYN) 500 mg Oral Tablet Take 500 mg by mouth Once a day   . nitroglycerin  (NITROSTAT ) 0.4 mg Sublingual Tablet, Sublingual 0.4 mg by Sublingual route Every 5 minutes as needed for 3 doses over 15 minutes   . polyethylene glycol (MIRALAX) 17 gram Oral Powder in Packet Take 17 g by mouth Once a day   . VIT A/VIT C/VIT E/ZINC/COPPER (PRESERVISION AREDS ORAL) Take by mouth     Results for orders placed in visit on 02/01/13 (from the past 3360 hour(s))   BASIC METABOLIC PANEL, NON-FASTING    Collection Time     02/10/13  2:20 PM       Result Value Range    BUN 29 (*) 8 - 20 mg/dL    CALCIUM 9.6  8.9 - 89.6 mg/dL    CHLORIDE 893  898 - 111 mEq/L    CARBON DIOXIDE 26  22 - 32 mEq/L    CREATININE 1.4 (*) 0.6 - 1.2 mg/dL    ESTIMATED GLOMERULAR FILTRATION RATE 51  Avg. 75    GLUCOSE,NONFAST 123 (*) 70 - 110 mg/dL    POTASSIUM 4.9  3.6 - 5.1 mEq/L    SODIUM 141  136 - 144 mEq/L   Results for orders placed in visit on 01/29/13 (from the past 3360 hour(s))   POCT URINE DIPSTICK    Collection Time     01/29/13 11:35 AM       Result Value Range    LEUKOCYTES negative      NITRITE negative      UROBILINOGEN 0.2      PROTEIN trace      PH 5      BLOOD negative      SPECIFIC GRAVITY 1.015      KETONE negative      BILIRUBIN negative      GLUCOSE normal     Results for orders placed in visit on 01/28/13 (from the past 3360 hour(s))   COMPREHENSIVE METABOLIC PANEL, NON-FASTING  Collection Time     01/29/13 12:00 AM       Result Value Range    ALBUMIN 3.9  3.5 - 4.8 gm/dL    ALKALINE PHOSPHATASE 73  20 - 130 U/L    ALT (SGPT) 18  4 - 36 U/L    AST (SGOT) 23  8 - 33 U/L    BUN 30 (*) 8 - 20 mg/dL    CALCIUM 9.6  8.9 - 89.6 mg/dL    CHLORIDE 891  898 - 111 mEq/L    CARBON DIOXIDE 26  22 - 32 mEq/L    CREATININE 1.3 (*) 0.6 - 1.2 mg/dL    ESTIMATED GLOMERULAR FILTRATION RATE 56  Avg. 75    GLUCOSE,NONFAST 127 (*) 70 - 110 mg/dL    POTASSIUM 4.9  3.6 - 5.1 mEq/L    SODIUM 142  136 - 144 mEq/L    BILIRUBIN, TOTAL 0.9  0.3 - 1.2 mg/dL    TOTAL PROTEIN 6.1 (*) 6.4 - 8.3 gm/dL   CBC/DIFF    Collection Time     01/29/13 12:00 AM       Result Value Range    BASOS ABS 0.10  0.00 - 0.20 10^3/uL    EOS ABS 0.1  0.0 - 0.5 10^3/uL    PMN ABS 6.1 (*) 1.6 - 5.5 10^3/uL    LYMPHS ABS 1.6  0.8 - 3.2 10^3/uL    MONOS ABS 0.7  0.2 - 0.8 10^3/uL    BASOPHILS 0.7      EOSINOPHIL 1.7      PMN'S 71.2      LYMPHOCYTES 18.9      MONOCYTES 7.5      HCT 40.7  38.9 - 50.5 %    HGB 13.7  13.4 - 17.3 gm/dL    MCH 67.9  72.0 - 66.8 pg    MCHC 33.6  32.8 - 36.0 gm/dL    MCV 04.6 (*) 17.5 - 95.0 fl    MPV 8.5  6.0 - 10.2 fl    PLATELET COUNT 244  140 - 440 10^3/uL    RBC 4.28 (*) 4.40 - 5.68 10^6/uL    RDW 14.1  10.9 - 15.1 %    WBC 8.6  3.3 - 9.3 10^3/uL   LIPID PANEL    Collection Time     01/29/13 12:00 AM       Result Value Range    CHOLESTEROL 119  0 - 199 mg/dL    HDL-CHOLESTEROL 50  29 - 71 mg/dL    LDL CHOLESTEROL,DIRECT 60  0 - 99 mg/dL    TRIGLYCERIDES 64  0 - 199 mg/dL    VLDL (CALCULATED) 13  0 - 50 mg/dL   VALPROIC ACID  LEVEL    Collection Time     01/29/13 12:00 AM       Result Value Range    VALPROIC ACID  12.0 (*) 50.0 - 100.0 ug/mL   VITAMIN B12    Collection Time     01/29/13 12:00 AM       Result Value Range    VITAMIN B12 561  180 - 914 pg/mL   POCT HGB A1C    Collection Time     02/10/13 10:40 AM       Result Value Range    POCT HGB A1C 7.2 (*) 4 - 6 %       Assessment & Plan:       ICD-9-CM    1.  Diabetes  mellitus 250.00 Discussed at length need to avoid hypoglycemic episodes.  Continue current Lantus  5 units.  Encouraged healthy diet to help control diabetes rather than increase the Lantus  dosage.     2. Renal insufficiency 593.9 OUTSIDE CONSULT/REFERRAL PROVIDER(AMB) to nephrology.  Stable, continue to monitor closely.     3. Carotid stenosis 433.10 RAO CAROTID ARTERY DUPLEX ordered     4. Carotid Artery Disease 433.11 OUTSIDE CONSULT/REFERRAL PROVIDER(AMB) to cardiology in [place].       Return in about 3 months (around 07/23/2013), or if symptoms worsen or fail to improve, for f/u DM II.    Tinnie Sexton, DO

## 2013-05-06 ENCOUNTER — Telehealth (INDEPENDENT_AMBULATORY_CARE_PROVIDER_SITE_OTHER): Payer: Self-pay

## 2013-05-06 NOTE — Telephone Encounter (Signed)
NT. DAUGHTER  PATTY APPT NOT UNTILSEPT. 25 ,2014 @ 2:50. She IS OK WITH THIS.

## 2013-05-06 NOTE — Telephone Encounter (Signed)
Daughter aware he will need repeat labs. She is agreeable.

## 2013-05-06 NOTE — Telephone Encounter (Signed)
Will have repeat labs performed in 1 month

## 2013-06-01 ENCOUNTER — Other Ambulatory Visit (INDEPENDENT_AMBULATORY_CARE_PROVIDER_SITE_OTHER): Payer: Self-pay

## 2013-06-01 ENCOUNTER — Ambulatory Visit (INDEPENDENT_AMBULATORY_CARE_PROVIDER_SITE_OTHER): Payer: Medicare Other | Admitting: Clinical Medical Laboratory

## 2013-06-01 NOTE — Progress Notes (Signed)
Labs only

## 2013-06-21 ENCOUNTER — Encounter (INDEPENDENT_AMBULATORY_CARE_PROVIDER_SITE_OTHER): Payer: Self-pay | Admitting: FAMILY PRACTICE

## 2013-06-24 ENCOUNTER — Other Ambulatory Visit (INDEPENDENT_AMBULATORY_CARE_PROVIDER_SITE_OTHER): Payer: Self-pay | Admitting: FAMILY PRACTICE

## 2013-06-25 ENCOUNTER — Other Ambulatory Visit (INDEPENDENT_AMBULATORY_CARE_PROVIDER_SITE_OTHER): Payer: Self-pay

## 2013-06-25 MED ORDER — NITROGLYCERIN 0.4 MG SUBLINGUAL TABLET
0.4000 mg | SUBLINGUAL_TABLET | SUBLINGUAL | Status: AC | PRN
Start: 2013-06-25 — End: ?

## 2013-06-28 ENCOUNTER — Ambulatory Visit (INDEPENDENT_AMBULATORY_CARE_PROVIDER_SITE_OTHER): Payer: Medicare Other | Admitting: FAMILY PRACTICE

## 2013-06-28 ENCOUNTER — Encounter (INDEPENDENT_AMBULATORY_CARE_PROVIDER_SITE_OTHER): Payer: Self-pay | Admitting: FAMILY PRACTICE

## 2013-06-28 VITALS — BP 148/78 | HR 67 | Temp 97.9°F | Resp 18 | Ht 63.0 in | Wt 151.0 lb

## 2013-06-28 NOTE — Progress Notes (Signed)
[facility]  [address]  [place] [address]  [phone]        Encounter Date: 06/28/2013 11:20 AM EDT      Name: Harry Rowland  Age: 79 y.o.  DOB: November 15, 1924  Sex: male    Chief Complaint:   Chief Complaint   Patient presents with   . Skin  Lesion     Right Arm       HPI  Pt presents to clinic for rapidly growing skin lesion on his right forearm over the past few weeks.  He hit the area and did cause minimal bleeding.  Non tender.  No other lesions.     He currently has caregivers for 4 total hours a day.  He feels that he doesn't need this and gets upset that he is paying for it.  Discussed at length the need for help, and the ability to accept it.      Review of Systems   Cardiovascular: Positive for leg swelling.   Skin: Positive for rash.   All other systems reviewed and are negative.        Current Outpatient Prescriptions   Medication Sig   . acetaminophen (TYLENOL) 325 mg Oral Tablet Take 650 mg by mouth Every 4 hours as needed   . atorvastatin (LIPITOR) 40 mg Oral Tablet Take 1 Tab (40 mg total) by mouth Every night   . Blood Sugar Diagnostic (ASCENSIA CONTOUR) Does not apply Strip 1 Strip by Does not apply route Twice daily   . CALCIUM CARBONATE/VITAMIN D3 (CALCARB 600 WITH VITAMIN D ORAL) Take by mouth   . ferrous sulfate (FERATAB) 324 mg (65 mg iron) Oral Tablet, Delayed Release (E.C.) Take 1 Tab (324 mg total) by mouth Twice daily   . hydrocortisone (CORTIZONE-10) 1 % Apply externally Cream by Apply Topically route Twice per day as needed   . insulin glargine (LANTUS SOLOSTAR) 100 unit/mL Subcutaneous Insulin Pen 5 Units by Subcutaneous route Every night   . Irbesartan (AVAPRO) 75 mg Oral Tablet TAKE ONE TABLET BY MOUTH EVERY DAY   . Lancets Does not apply Misc 1 Units by Does not apply route Twice daily   . MULTIVITAMINS WITH FLUORIDE (MULTI-VITAMIN PO) Take by mouth   . naproxen (NAPROSYN) 500 mg Oral Tablet Take 500 mg by mouth Once a day    . nitroglycerin (NITROSTAT) 0.4 mg Sublingual Tablet, Sublingual 1 Tab (0.4 mg total) by Sublingual route Every 5 minutes as needed for 3 doses   . polyethylene glycol (MIRALAX) 17 gram Oral Powder in Packet Take 17 g by mouth Once a day   . VIT A/VIT C/VIT E/ZINC/COPPER (PRESERVISION AREDS ORAL) Take by mouth       Examination  Vitals: BP 148/78   Pulse 67   Temp(Src) 36.6 C (97.9 F) (Tympanic)   Resp 18   Ht 1.6 m (5\' 3" )   Wt 68.493 kg (151 lb)   BMI 26.76 kg/m2   SpO2 98%    Physical Exam   Nursing note and vitals reviewed.  Constitutional: He is oriented to person, place, and time and well-developed, well-nourished, and in no distress.   HENT:   Head: Normocephalic and atraumatic.   Cardiovascular: Normal rate and regular rhythm.    Murmur heard.  Pulmonary/Chest: Effort normal and breath sounds normal. No respiratory distress. He has no wheezes.   Abdominal: Soft. He exhibits no distension. There is no tenderness.   Musculoskeletal: He exhibits edema.  Neurological: He is alert and oriented to person, place, and time.   Skin:        Psychiatric: Affect normal.     .  Assessment and Plan    Gaspar was seen today for skin  lesion.    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   - OUTSIDE CONSULT/REFERRAL PROVIDER(AMB); Future    Keep previously scheduled appt 07/23/2013      [name], DO

## 2013-07-10 ENCOUNTER — Telehealth (INDEPENDENT_AMBULATORY_CARE_PROVIDER_SITE_OTHER): Payer: Self-pay | Admitting: FAMILY PRACTICE

## 2013-07-10 NOTE — Telephone Encounter (Signed)
I retrieved a phone call from Jacklynn Ganong from the appointment desk phone.  He really needs to talk to someone.    The message was timed and dated for Friday, 07/09/13 @ 1:39pm.  I am uncertain why the message was not retrieved before I found it on Saturday.    Please call him asap.    Thanks    Amil Amen

## 2013-07-12 ENCOUNTER — Encounter (INDEPENDENT_AMBULATORY_CARE_PROVIDER_SITE_OTHER): Payer: Self-pay | Admitting: FAMILY PRACTICE

## 2013-07-12 NOTE — Telephone Encounter (Signed)
Patient received a call to NT of his app. Patient was confused as to why he needed to be seen. I explained that app was made previously and he did not need to be seen . He has a F/U app on 07/23/13, and his daughter will be here with him. [employer]

## 2013-07-12 NOTE — Telephone Encounter (Signed)
Bev please call and see what he needs thanks

## 2013-07-23 ENCOUNTER — Encounter (INDEPENDENT_AMBULATORY_CARE_PROVIDER_SITE_OTHER): Payer: Self-pay | Admitting: FAMILY PRACTICE

## 2013-07-23 ENCOUNTER — Ambulatory Visit (INDEPENDENT_AMBULATORY_CARE_PROVIDER_SITE_OTHER): Payer: Medicare Other | Admitting: FAMILY PRACTICE

## 2013-07-23 VITALS — BP 118/78 | HR 72 | Temp 97.0°F | Resp 18 | Ht 63.0 in | Wt 159.0 lb

## 2013-07-23 LAB — POCT HGB A1C

## 2013-07-23 NOTE — Progress Notes (Signed)
[facility]  [address]  [place] [address]  [phone]        Encounter Date: 07/23/2013 11:00 AM EDT       Name: Harry Rowland  Age: 79 y.o.  DOB: Apr 02, 1924  Sex: male    Chief Complaint:   Chief Complaint   Patient presents with   . Diabetes     A 1C       HPI   Pt presents to clinic with his daughter for DM II check.  His FS having been running <160 on average, occasional 200.  Does not monitor what he eats, gets occasional leg swelling.  Can not wear the compression stalking and doesn't monitor his salt intake.   Overall feeling good, getting around good.  Does not like workers coming 2 times a day because it decreases his independence.  Discussed at length the need to have someone help with certain activities in order for him to live on his own.  He is agreeable.      No other concerns today.      History:  Family History   Problem Relation Age of Onset   . Blood Clots Mother    . Diabetes Father    . Hypertension Father    . Stroke Father    . Cancer Sister      BREAST   . Diabetes Sister    . Arthritis-osteo Sister    . Hypertension Sister    . Diabetes Paternal Aunt    . Stroke Maternal Grandmother      Past Medical History   Diagnosis Date   . CAD (coronary artery disease)    . Heart attack      1970   . Stroke      2013     Past Surgical History   Procedure Laterality Date   . Hx ankle fracture tx     . Hx wrist fracture tx     . Hx heart surgery       double by pass   . Hx carotid endarterectomy     . Hx coronary artery bypass graft     . Hx tonsillectomy     . Hx appendectomy       History   Smoking status   . Former Smoker   Smokeless tobacco   . No     History   Alcohol Use: No     History   Drug Use No         Review of Systems   Constitutional: Negative for fever, chills, weight loss and malaise/fatigue.   HENT: Negative for congestion and sore throat.    Eyes: Negative for blurred vision.    Respiratory: Negative for cough and shortness of breath.    Cardiovascular: Negative for chest pain and leg swelling.   Gastrointestinal: Negative for heartburn, nausea, vomiting, abdominal pain, diarrhea and constipation.   Genitourinary: Negative for dysuria.   Musculoskeletal: Negative for joint pain.   Skin: Negative for rash.   Neurological: Negative for dizziness, focal weakness and headaches.   Psychiatric/Behavioral: Negative for depression. The patient does not have insomnia.        Current Outpatient Prescriptions   Medication Sig   . acetaminophen (TYLENOL) 325 mg Oral Tablet Take 650 mg by mouth Every 4 hours as needed   . atorvastatin (LIPITOR) 40 mg Oral Tablet Take 1 Tab (40 mg total) by mouth Every night   .  Blood Sugar Diagnostic (ASCENSIA CONTOUR) Does not apply Strip 1 Strip by Does not apply route Twice daily   . CALCIUM CARBONATE/VITAMIN D3 (CALCARB 600 WITH VITAMIN D ORAL) Take by mouth   . ferrous sulfate (FERATAB) 324 mg (65 mg iron) Oral Tablet, Delayed Release (E.C.) Take 1 Tab (324 mg total) by mouth Twice daily   . hydrocortisone (CORTIZONE-10) 1 % Apply externally Cream by Apply Topically route Twice per day as needed   . insulin glargine (LANTUS SOLOSTAR) 100 unit/mL Subcutaneous Insulin Pen 5 Units by Subcutaneous route Every night   . Irbesartan (AVAPRO) 75 mg Oral Tablet TAKE ONE TABLET BY MOUTH EVERY DAY   . Lancets Does not apply Misc 1 Units by Does not apply route Twice daily   . MULTIVITAMINS WITH FLUORIDE (MULTI-VITAMIN PO) Take by mouth   . naproxen (NAPROSYN) 500 mg Oral Tablet Take 500 mg by mouth Once a day   . nitroglycerin (NITROSTAT) 0.4 mg Sublingual Tablet, Sublingual 1 Tab (0.4 mg total) by Sublingual route Every 5 minutes as needed for 3 doses   . polyethylene glycol (MIRALAX) 17 gram Oral Powder in Packet Take 17 g by mouth Once a day   . VIT A/VIT C/VIT E/ZINC/COPPER (PRESERVISION AREDS ORAL) Take by mouth       Examination   Vitals: BP 118/78   Pulse 72   Temp(Src) 36.1 C (97 F) (Tympanic)   Resp 18   Ht 1.6 m (5\' 3" )   Wt 72.122 kg (159 lb)   BMI 28.17 kg/m2   SpO2 98%    Physical Exam   Nursing note and vitals reviewed.  Constitutional: He is oriented to person, place, and time and well-developed, well-nourished, and in no distress.   HENT:   Head: Normocephalic and atraumatic.   Right Ear: External ear normal.   Left Ear: External ear normal.   Nose: Nose normal.   Mouth/Throat: Oropharynx is clear and moist.   Eyes: Conjunctivae are normal.   Neck: Neck supple. No thyromegaly present.   Cardiovascular: Normal rate and regular rhythm.    Pulmonary/Chest: Effort normal and breath sounds normal. No respiratory distress. He has no wheezes.   Abdominal: Soft. Bowel sounds are normal. He exhibits no distension. There is no tenderness.   Musculoskeletal: He exhibits edema.   Trace b/l lower ext edema   Neurological: He is alert and oriented to person, place, and time.   Skin: Skin is warm and dry.   kerancanthoma     Psychiatric: Affect normal.       .  SODIUM 138 mEq/L    POTASSIUM 4.5 mEq/L    CHLORIDE 107 mEq/L    CO2 TOTAL 26 mEq/L    BUN 24 H mg/dL 1-61   CREATININE, SERUM 1.3 H mg/dL    GLUCOSE 096 H mg/dL    CALCIUM 9.4 mg/dL    BILIRUBIN, TOTAL 0.6 mg/dL   ALK. PHOSPHATASE 75 U/L   AST 21  ALT 15 U/L  PROTEIN, TOTAL 5.9 L gm/dL  ALBUMIN 3.8 gm/dL    eGFR 56 Avg. 75    Assessment and Plan    Devansh was seen today for diabetes.    Diabetes mellitus  Stable, goal <200. Risk  Greater for hypoglycemia with tighter control.  Continue current medications, pt refused to stop Lantus 5 units.    - POCT HGB A1C    Hypertension  Stable, continue current medications.  Encouraged low sodium diet.     Hyperlipidemia LDL goal <70  Stable, continue current medications.      CVA, old, speech/language deficit   Stable, continue current medications with risk modifications.          Return in about 6 months (around 01/23/2014), or if symptoms worsen or fail to improve, for f/u dm ii.        [name], DO

## 2013-08-23 ENCOUNTER — Other Ambulatory Visit (INDEPENDENT_AMBULATORY_CARE_PROVIDER_SITE_OTHER): Payer: Self-pay

## 2013-08-23 MED ORDER — BLOOD SUGAR DIAGNOSTIC STRIPS
1.00 | ORAL_STRIP | Freq: Two times a day (BID) | Status: DC
Start: 2013-08-23 — End: 2014-03-23

## 2013-09-20 ENCOUNTER — Telehealth (INDEPENDENT_AMBULATORY_CARE_PROVIDER_SITE_OTHER): Payer: Self-pay

## 2013-09-20 NOTE — Telephone Encounter (Signed)
 Daughter- Odetta calling, Pt  Coughing and sneezing at night, not getting much sleep. No fever, feels fine. She feels it is allergies.  She bought Claritin OTC and will try for a few days. She will have care giver office if symptoms worsen.

## 2013-09-20 NOTE — Telephone Encounter (Signed)
 Agree with plan

## 2013-10-27 ENCOUNTER — Telehealth (INDEPENDENT_AMBULATORY_CARE_PROVIDER_SITE_OTHER): Payer: Self-pay | Admitting: FAMILY PRACTICE

## 2013-10-27 NOTE — Telephone Encounter (Signed)
 Patient's daughter calling stating that patient is broke out on his back with large spots. Patient's daughter states they are now down his legs as well. Patient denies itching, burning, or draining.     She thinks  it has been there since Monday. They were using hydrocortisone cream but doesn't seem to be helping. Requesting appt.

## 2013-10-27 NOTE — Telephone Encounter (Signed)
 Spoke to pt daughter, she states they were using Hydrocortizone Cream. Pt. is not complaining of pain or itch, denies any changes in house hold items, clothes, soaps, detergents etc...   She will con't cream if condition persists He  she will call Monday.

## 2013-11-15 ENCOUNTER — Other Ambulatory Visit (INDEPENDENT_AMBULATORY_CARE_PROVIDER_SITE_OTHER): Payer: Self-pay

## 2013-12-15 ENCOUNTER — Other Ambulatory Visit (INDEPENDENT_AMBULATORY_CARE_PROVIDER_SITE_OTHER): Payer: Self-pay | Admitting: FAMILY PRACTICE

## 2013-12-23 ENCOUNTER — Other Ambulatory Visit (INDEPENDENT_AMBULATORY_CARE_PROVIDER_SITE_OTHER): Payer: Self-pay

## 2013-12-23 MED ORDER — INSULIN GLARGINE (U-100) 100 UNIT/ML (3 ML) SUBCUTANEOUS PEN
5.0000 [IU] | PEN_INJECTOR | Freq: Every evening | SUBCUTANEOUS | Status: AC
Start: 2013-12-23 — End: ?

## 2013-12-30 ENCOUNTER — Other Ambulatory Visit (INDEPENDENT_AMBULATORY_CARE_PROVIDER_SITE_OTHER): Payer: Self-pay | Admitting: FAMILY PRACTICE

## 2014-01-11 ENCOUNTER — Telehealth (INDEPENDENT_AMBULATORY_CARE_PROVIDER_SITE_OTHER): Payer: Self-pay | Admitting: FAMILY PRACTICE

## 2014-01-11 NOTE — Telephone Encounter (Signed)
Patient's daughter calling stating she is wanting to get his hearing tested again, she is wanting to know whom you recommend.

## 2014-01-11 NOTE — Telephone Encounter (Signed)
[facility]

## 2014-01-13 ENCOUNTER — Other Ambulatory Visit (INDEPENDENT_AMBULATORY_CARE_PROVIDER_SITE_OTHER): Payer: Self-pay | Admitting: FAMILY PRACTICE

## 2014-01-13 DIAGNOSIS — H9190 Unspecified hearing loss, unspecified ear: Secondary | ICD-10-CM

## 2014-01-13 NOTE — Telephone Encounter (Signed)
[facility]

## 2014-01-26 ENCOUNTER — Ambulatory Visit (INDEPENDENT_AMBULATORY_CARE_PROVIDER_SITE_OTHER): Payer: Medicare Other | Admitting: FAMILY PRACTICE

## 2014-01-26 ENCOUNTER — Ambulatory Visit (INDEPENDENT_AMBULATORY_CARE_PROVIDER_SITE_OTHER): Payer: Self-pay | Admitting: Audiologist

## 2014-01-26 VITALS — BP 124/64 | HR 70 | Temp 96.7°F | Resp 20 | Ht 63.0 in

## 2014-01-26 DIAGNOSIS — R5381 Other malaise: Secondary | ICD-10-CM

## 2014-01-26 DIAGNOSIS — E785 Hyperlipidemia, unspecified: Secondary | ICD-10-CM

## 2014-01-26 DIAGNOSIS — R5383 Other fatigue: Secondary | ICD-10-CM

## 2014-01-26 DIAGNOSIS — I10 Essential (primary) hypertension: Secondary | ICD-10-CM

## 2014-01-26 DIAGNOSIS — E119 Type 2 diabetes mellitus without complications: Secondary | ICD-10-CM

## 2014-01-26 LAB — LIPID PANEL
CHOLESTEROL: 109 mg/dL (ref 0–199)
HDL-CHOLESTEROL: 45 mg/dL (ref 29–71)
LDL CHOLESTEROL,DIRECT: 50 mg/dL (ref 0–99)
TRIGLYCERIDES: 87 mg/dL (ref 0–199)
VLDL (CALCULATED): 17 mg/dL (ref 0–50)

## 2014-01-26 LAB — COMPREHENSIVE METABOLIC PANEL, NON-FASTING
ALBUMIN: 3.7 gm/dL (ref 3.5–4.8)
ALKALINE PHOSPHATASE: 83 U/L (ref 20–130)
ALT (SGPT): 17 U/L (ref 4–36)
ALT (SGPT): 17 U/L (ref 4–36)
AST (SGOT): 20 U/L (ref 8–33)
BILIRUBIN, TOTAL: 0.9 mg/dL (ref 0.3–1.2)
BUN: 23 mg/dL — ABNORMAL HIGH (ref 8–20)
BUN: 23 mg/dL — ABNORMAL HIGH (ref 8–20)
CALCIUM: 9.7 mg/dL (ref 8.9–10.3)
CARBON DIOXIDE: 29 meq/L (ref 22–32)
CHLORIDE: 104 meq/L (ref 101–111)
CREATININE: 1.3 mg/dL — ABNORMAL HIGH (ref 0.6–1.2)
ESTIMATED GLOMERULAR FILTRATION RATE: 56
GLUCOSE,NONFAST: 120 mg/dL — ABNORMAL HIGH (ref 70–110)
POTASSIUM: 4.9 meq/L (ref 3.6–5.1)
SODIUM: 139 meq/L (ref 136–144)
TOTAL PROTEIN: 5.8 g/dL — ABNORMAL LOW (ref 6.4–8.3)

## 2014-01-26 LAB — CBC/DIFF
BASOPHILS: 0.9 %
BASOS ABS: 0.1 10 (ref 0.00–0.20)
EOS ABS: 0.1 10 (ref 0.0–0.5)
EOSINOPHIL: 1.3 %
HCT: 39 % (ref 38.9–50.5)
HGB: 12.9 gm/dL — ABNORMAL LOW (ref 13.4–17.3)
LYMPHOCYTES: 19.1 %
LYMPHS ABS: 1.7 10 (ref 0.8–3.2)
MCH: 31.9 pg (ref 27.9–33.1)
MCHC: 33 g/dL (ref 32.8–36.0)
MCV: 96.6 fl — ABNORMAL HIGH (ref 82.4–95.0)
MONOCYTES: 7.8 %
MONOS ABS: 0.7 10 (ref 0.2–0.8)
MPV: 8.9 fl (ref 6.0–10.2)
PLATELET COUNT (AUTO): 308 10 (ref 140–440)
PMN ABS (AUTO): 6.2 10 — ABNORMAL HIGH (ref 1.6–5.5)
PMN'S: 70.9 %
RBC: 4.04 10 — ABNORMAL LOW (ref 4.40–5.68)
RDW: 14 % (ref 10.9–15.1)
WBC: 8.8 10 (ref 3.3–9.3)
WBC: 8.8 10 (ref 3.3–9.3)

## 2014-01-26 LAB — MICROALBUMIN/CREATININE RATIO, URINE, RANDOM
CREATININE, UR RAND: 77.6 mg/dL
MICROALBUMIN RANDOM URINE: 5.3 mg/dL — AB (ref 0.0–1.9)
MICROALBUMIN/CREAT RATIO: 68

## 2014-01-26 NOTE — Progress Notes (Signed)
[facility]  [address]  [place] [id number]  [phone]        Encounter Date: 01/26/2014 11:00 AM EST      Name: Harry Rowland  Age: 80 y.o.  DOB: 09-25-1924  Sex: male    Chief Complaint:   Chief Complaint   Patient presents with    Follow Up Dm (Diabetes Mellitus)    Weakness       HPI  Harry Rowland, a pleasant 80 y/o male, presents to clinic with health care worker for 6 month f/u DM.  FS this morning 101; averaging in the lower 100's.  Taking Lantus as directed.  Does not follow diet plan.  Goes to [employer] everyday for cookie and coffee.      Main concern today is worsening weakness and instability.  Has been falling over from sitting position more frequently.  He also had a large bruise appear on 01/14/2014 that he is unsure how he got.  He has no pain in the area (right side).  He does complain of pain in the right lower extremity and right shoulder that is chronic in nature.  He has severe arthritis.  Takes tylenol on occasion and naprosyn PRN.  Per health care worker does not take often.      Hearing seems to be worse as well as his speech.  Not talking as clear compared to 6 months previously.       Review of Systems   Musculoskeletal: Positive for joint pain.   Neurological: Positive for weakness.   All other systems reviewed and are negative.        Current Outpatient Prescriptions   Medication Sig    acetaminophen (TYLENOL) 325 mg Oral Tablet Take 650 mg by mouth Every 4 hours as needed    atorvastatin (LIPITOR) 40 mg Oral Tablet Take 1 Tab (40 mg total) by mouth Every evening    Blood Sugar Diagnostic (CONTOUR NEXT STRIPS) Strip 1 Strip by Does not apply route Twice daily    CALCIUM CARBONATE/VITAMIN D3 (CALCARB 600 WITH VITAMIN D ORAL) Take by mouth    ferrous sulfate (FERATAB) 324 mg (65 mg iron) Oral Tablet, Delayed Release (E.C.) Take 1 Tab (324 mg total) by mouth Twice daily    hydrocortisone (CORTIZONE-10) 1 % Apply externally  Cream by Apply Topically route Twice per day as needed    insulin glargine (LANTUS SOLOSTAR) 100 unit/mL Subcutaneous Insulin Pen 5 Units by Subcutaneous route Every night    Irbesartan (AVAPRO) 75 mg Oral Tablet TAKE ONE TABLET BY MOUTH EVERY DAY    Lancets Does not apply Misc 1 Units by Does not apply route Twice daily    MULTIVITAMINS WITH FLUORIDE (MULTI-VITAMIN PO) Take by mouth    naproxen (NAPROSYN) 500 mg Oral Tablet Take 500 mg by mouth Once a day    nitroglycerin (NITROSTAT) 0.4 mg Sublingual Tablet, Sublingual 1 Tab (0.4 mg total) by Sublingual route Every 5 minutes as needed for 3 doses    polyethylene glycol (MIRALAX) 17 gram Oral Powder in Packet Take 17 g by mouth Once a day    VIT A/VIT C/VIT E/ZINC/COPPER (PRESERVISION AREDS ORAL) Take by mouth       Examination  Vitals: BP 124/64   Pulse 70   Temp(Src) 35.9 C (96.7 F) (Tympanic)   Resp 20   Ht 1.6 m (5\' 3" )   SpO2 96%    Physical Exam   Nursing note and vitals  reviewed.  Constitutional: He is oriented to person, place, and time and well-developed, well-nourished, and in no distress.   HENT:   Head: Normocephalic and atraumatic.   Nose: Nose normal.   Mouth/Throat: Oropharynx is clear and moist.   Eyes: Conjunctivae are normal.   Neck: Neck supple. No thyromegaly present.   Cardiovascular: Normal rate and regular rhythm.    Pulmonary/Chest: Effort normal and breath sounds normal. No respiratory distress. He has no wheezes.   Abdominal: Soft. He exhibits no distension. There is no tenderness.   Musculoskeletal: He exhibits no edema.   Neurological: He is alert and oriented to person, place, and time.   Skin: Skin is warm and dry.        Psychiatric: Affect normal.       .    Assessment and Plan    Harry Rowland was seen today for follow up dm (diabetes mellitus) and weakness.    Diabetes mellitus   Check labs, continue Lantus.      Hypertension  Hyperlipidemia LDL goal <70   Stable, continue current meds.     Renal insufficiency   Following with  Dr. Krista Blue, monitoring labs.     Generalized weakness  - HOME HEALTH ORDER AND CERTIFICATION; Future        Return in about 6 months (around 07/26/2014), or if symptoms worsen or fail to improve.      [name], DO

## 2014-01-27 ENCOUNTER — Encounter (INDEPENDENT_AMBULATORY_CARE_PROVIDER_SITE_OTHER): Payer: Self-pay | Admitting: FAMILY PRACTICE

## 2014-01-27 LAB — HGA1C (HEMOGLOBIN A1C WITH EST AVG GLUCOSE): HEMOGLOBIN A1C: 7.8 % — ABNORMAL HIGH (ref 4.1–5.7)

## 2014-01-31 ENCOUNTER — Other Ambulatory Visit (INDEPENDENT_AMBULATORY_CARE_PROVIDER_SITE_OTHER): Payer: Self-pay

## 2014-01-31 ENCOUNTER — Encounter (INDEPENDENT_AMBULATORY_CARE_PROVIDER_SITE_OTHER): Payer: Self-pay | Admitting: FAMILY PRACTICE

## 2014-02-01 MED ORDER — METOPROLOL SUCCINATE ER 50 MG TABLET,EXTENDED RELEASE 24 HR
50.0000 mg | ORAL_TABLET | Freq: Every day | ORAL | Status: DC
Start: 2014-01-31 — End: 2014-04-18

## 2014-02-03 ENCOUNTER — Ambulatory Visit (INDEPENDENT_AMBULATORY_CARE_PROVIDER_SITE_OTHER): Payer: Self-pay | Admitting: Audiologist

## 2014-02-04 ENCOUNTER — Telehealth (INDEPENDENT_AMBULATORY_CARE_PROVIDER_SITE_OTHER): Payer: Self-pay

## 2014-02-04 DIAGNOSIS — M545 Low back pain, unspecified: Secondary | ICD-10-CM

## 2014-02-04 DIAGNOSIS — R0781 Pleurodynia: Secondary | ICD-10-CM

## 2014-02-04 NOTE — Telephone Encounter (Signed)
Pt daughter ph . States her dad is C/O back pain and the Bruise on his left side my have gotten worse again. She would like an XR.

## 2014-02-04 NOTE — Telephone Encounter (Signed)
After discussing with the nursing staff/caregivers, the bruising has not worsened just taking a long time to heal.  No pain over the bruise but complaining of left lower back pain. Will order CXR/ribs and lumbar spine.

## 2014-02-09 ENCOUNTER — Telehealth (INDEPENDENT_AMBULATORY_CARE_PROVIDER_SITE_OTHER): Payer: Self-pay | Admitting: FAMILY PRACTICE

## 2014-02-09 ENCOUNTER — Ambulatory Visit (INDEPENDENT_AMBULATORY_CARE_PROVIDER_SITE_OTHER): Payer: Self-pay | Admitting: FAMILY PRACTICE

## 2014-02-09 NOTE — Telephone Encounter (Signed)
[facility], is needing to speak with you in regards to patient's status. He had x rays yesterday and confirmed that there is a rib fracture on the 5th rib on the right, today he is unable to do much therapy wise. He is wanting to discuss the plan with you b/c the patient is still wanting to go to a local bakery he normally goes to and JB is thinking he needs out patient therapy once his ribs have healed and he is able to preform therapy.   Please call JB at ph. [phone]

## 2014-02-10 NOTE — Telephone Encounter (Signed)
Nurse spoke with Harry Rowland.  He is going to work with Harry Rowland which I encouraged.  He needs at home therapy at this time for his weakness and balance issues.

## 2014-02-11 NOTE — Telephone Encounter (Signed)
[facility]. He states as long as the pt is home bound he can continue PT. If a pt is able to go out of the home everyday, Medicare will not pay for in home PT. He will continue Pt next week and update Dr. Grayland Jackomes at that time. He states he has Nurse, mental healthestalished  Strengthening program with the. He states the pt is anxious for nice weather so he can walk outside. JBstates since pt was told of his rib fracture, he has complained more of pain than before. He states his shoulder hurts which is not a new problem. JB observed a bruise on pt rt side, from a previous fall. Dr.Tomes addressed this at his last office visit.

## 2014-02-15 ENCOUNTER — Ambulatory Visit (INDEPENDENT_AMBULATORY_CARE_PROVIDER_SITE_OTHER): Payer: Medicare Other | Admitting: FAMILY PRACTICE

## 2014-02-15 DIAGNOSIS — H6120 Impacted cerumen, unspecified ear: Principal | ICD-10-CM

## 2014-02-15 NOTE — Progress Notes (Signed)
Ear wax removed from left ear.

## 2014-02-16 ENCOUNTER — Ambulatory Visit (INDEPENDENT_AMBULATORY_CARE_PROVIDER_SITE_OTHER): Payer: Self-pay | Admitting: Audiologist

## 2014-02-28 ENCOUNTER — Ambulatory Visit (INDEPENDENT_AMBULATORY_CARE_PROVIDER_SITE_OTHER): Payer: Self-pay | Admitting: Audiologist

## 2014-03-23 ENCOUNTER — Other Ambulatory Visit (INDEPENDENT_AMBULATORY_CARE_PROVIDER_SITE_OTHER): Payer: Self-pay | Admitting: FAMILY PRACTICE

## 2014-03-23 ENCOUNTER — Other Ambulatory Visit (INDEPENDENT_AMBULATORY_CARE_PROVIDER_SITE_OTHER): Payer: Self-pay

## 2014-03-24 MED ORDER — BLOOD SUGAR DIAGNOSTIC STRIPS
1.0000 | ORAL_STRIP | Freq: Three times a day (TID) | Status: DC
Start: 2014-03-23 — End: 2014-03-24

## 2014-04-18 ENCOUNTER — Other Ambulatory Visit (INDEPENDENT_AMBULATORY_CARE_PROVIDER_SITE_OTHER): Payer: Self-pay

## 2014-04-18 MED ORDER — METOPROLOL SUCCINATE ER 50 MG TABLET,EXTENDED RELEASE 24 HR
50.0000 mg | ORAL_TABLET | Freq: Every day | ORAL | Status: AC
Start: 2014-04-18 — End: ?

## 2014-05-21 ENCOUNTER — Emergency Department (EMERGENCY_DEPARTMENT_HOSPITAL): Payer: Medicare Other

## 2014-05-21 ENCOUNTER — Emergency Department (EMERGENCY_DEPARTMENT_HOSPITAL)
Admission: EM | Admit: 2014-05-21 | Discharge: 2014-05-21 | Payer: Medicare Other | Attending: Emergency Medicine | Admitting: Emergency Medicine

## 2014-05-21 DIAGNOSIS — W1809XA Striking against other object with subsequent fall, initial encounter: Secondary | ICD-10-CM

## 2014-05-21 DIAGNOSIS — S1190XA Unspecified open wound of unspecified part of neck, initial encounter: Secondary | ICD-10-CM

## 2014-05-23 ENCOUNTER — Encounter (INDEPENDENT_AMBULATORY_CARE_PROVIDER_SITE_OTHER): Payer: Self-pay | Admitting: Family Medicine

## 2014-05-23 ENCOUNTER — Telehealth (INDEPENDENT_AMBULATORY_CARE_PROVIDER_SITE_OTHER): Payer: Self-pay | Admitting: FAMILY PRACTICE

## 2014-05-23 NOTE — Telephone Encounter (Signed)
Please give him an appt this week or next

## 2014-05-23 NOTE — Telephone Encounter (Signed)
PC from patient's daughter ,Alexia Freestoneatty, patient fell on Saturday and was taken to the ER for evaluation. He was found to have a skin tear on his neck that is not infected. ER advised to FU with PCP in 7 days. Please advise.

## 2014-05-23 NOTE — Telephone Encounter (Signed)
Pt scheduled to see Dr. Corine ShelterWatkins today.  Daughter aware per Bethann Berkshirerisha, [place].  Elissa Lovetthristine L Sammantha Mehlhaff, LPN

## 2014-05-25 ENCOUNTER — Telehealth (INDEPENDENT_AMBULATORY_CARE_PROVIDER_SITE_OTHER): Payer: Self-pay

## 2014-05-25 DIAGNOSIS — I63239 Cerebral infarction due to unspecified occlusion or stenosis of unspecified carotid arteries: Secondary | ICD-10-CM

## 2014-05-25 NOTE — Telephone Encounter (Signed)
PC from pt daughter Alexia Freestoneatty. She would like to canc his app tomorrow w/ Dr Corine ShelterWatkins, she [facility] and [facility] his dressing on his face and said healing process  WNL and he did not need to f/u here. App canceled. She would also like an order for carotid u/s. Order Placed. She also would like to know if and when the times comes for him to be placed in Assisted Living or Nursing home, where do you recommend , he can not afford [facility]. She states he gets very upset when this subject is even thought of. She would like all the help and support from our office that we can give, if this time should ever arise.  She is willing to add hours to the care givers that are currently caring for him.

## 2014-05-26 ENCOUNTER — Encounter (INDEPENDENT_AMBULATORY_CARE_PROVIDER_SITE_OTHER): Payer: Self-pay | Admitting: Family Medicine

## 2014-05-26 NOTE — Telephone Encounter (Signed)
Discussed with patients daughter, Alexia Freestoneatty.She will discuss with Dr Grayland Jackomes if and went the time comes.

## 2014-05-26 NOTE — Telephone Encounter (Signed)
Reviewed. I do not know this patient and therefore, rec that pt and daughter wait until Dr. Grayland Jackomes returns to discuss nursing home or assisted living facilities.

## 2014-07-04 ENCOUNTER — Other Ambulatory Visit (INDEPENDENT_AMBULATORY_CARE_PROVIDER_SITE_OTHER): Payer: Self-pay | Admitting: FAMILY PRACTICE

## 2014-07-08 ENCOUNTER — Other Ambulatory Visit (INDEPENDENT_AMBULATORY_CARE_PROVIDER_SITE_OTHER): Payer: Self-pay | Admitting: FAMILY PRACTICE

## 2014-07-11 ENCOUNTER — Other Ambulatory Visit (INDEPENDENT_AMBULATORY_CARE_PROVIDER_SITE_OTHER): Payer: Self-pay

## 2014-07-11 ENCOUNTER — Telehealth (INDEPENDENT_AMBULATORY_CARE_PROVIDER_SITE_OTHER): Payer: Self-pay

## 2014-07-11 DIAGNOSIS — E785 Hyperlipidemia, unspecified: Secondary | ICD-10-CM

## 2014-07-11 DIAGNOSIS — E119 Type 2 diabetes mellitus without complications: Secondary | ICD-10-CM

## 2014-07-11 NOTE — Telephone Encounter (Signed)
Per Dr Grayland Jackomes add A1C and Hepatic Panel. Orders Placed.

## 2014-07-11 NOTE — Telephone Encounter (Signed)
PC from pt daughter Harry Rowland. Per phone conversation , she states pt has a few episodes of being shaky. Pt had called her on Sat and told her he had fallen. After taking with him he did not fall completely on the floor , he lost his balance and landed on a trash can,  uninjured. She states pt saw Dr Krista Blueemarco and he ordered labs, she would like to have them at the same time, Harry Rowland will fax the order to our office, she would like any labs added that he will be needing  before his app on 08/10/14, include A1C and any testing for dehydration please.

## 2014-07-12 ENCOUNTER — Ambulatory Visit (INDEPENDENT_AMBULATORY_CARE_PROVIDER_SITE_OTHER): Payer: Medicare Other | Admitting: Clinical Medical Laboratory

## 2014-07-12 DIAGNOSIS — E785 Hyperlipidemia, unspecified: Secondary | ICD-10-CM

## 2014-07-12 DIAGNOSIS — N183 Chronic kidney disease, stage 3 unspecified (CMS HCC): Secondary | ICD-10-CM

## 2014-07-12 DIAGNOSIS — E119 Type 2 diabetes mellitus without complications: Secondary | ICD-10-CM

## 2014-07-12 LAB — HEPATIC FUNCTION PANEL
ALBUMIN: 3.6 gm/dL (ref 3.5–4.8)
ALKALINE PHOSPHATASE: 79 U/L (ref 20–130)
ALT (SGPT): 12 U/L (ref 4–36)
AST (SGOT): 18 U/L (ref 8–33)
BILIRUBIN, TOTAL: 0.7 mg/dL (ref 0.3–1.2)
BILIRUBIN,CONJUGATED: 0.1 mg/dL (ref 0.1–0.5)
TOTAL PROTEIN: 6 gm/dL — ABNORMAL LOW (ref 6.4–8.3)

## 2014-07-12 LAB — POCT HGB A1C: POCT HGB A1C: 7.3 % — AB (ref 4–6)

## 2014-07-12 NOTE — Progress Notes (Signed)
Labs Only

## 2014-07-14 NOTE — Telephone Encounter (Signed)
Labs stable.  Seen at lab visit (no acute changes seen)  Will keep appt and call if happens again.

## 2014-07-14 NOTE — Telephone Encounter (Signed)
NT PT RESULTS. Reminded him of app on 08/10/14 @ 11:20. I will mail copy of labs to pt and his daughter Alexia Freestoneatty.

## 2014-07-16 ENCOUNTER — Other Ambulatory Visit (INDEPENDENT_AMBULATORY_CARE_PROVIDER_SITE_OTHER): Payer: Self-pay | Admitting: FAMILY PRACTICE

## 2014-07-19 ENCOUNTER — Other Ambulatory Visit (INDEPENDENT_AMBULATORY_CARE_PROVIDER_SITE_OTHER): Payer: Self-pay

## 2014-07-19 DIAGNOSIS — H9190 Unspecified hearing loss, unspecified ear: Secondary | ICD-10-CM

## 2014-07-19 DIAGNOSIS — I63239 Cerebral infarction due to unspecified occlusion or stenosis of unspecified carotid arteries: Secondary | ICD-10-CM

## 2014-07-21 ENCOUNTER — Other Ambulatory Visit (INDEPENDENT_AMBULATORY_CARE_PROVIDER_SITE_OTHER): Payer: Self-pay

## 2014-07-21 DIAGNOSIS — I63239 Cerebral infarction due to unspecified occlusion or stenosis of unspecified carotid arteries: Secondary | ICD-10-CM

## 2014-07-26 ENCOUNTER — Encounter (INDEPENDENT_AMBULATORY_CARE_PROVIDER_SITE_OTHER): Payer: Self-pay | Admitting: FAMILY PRACTICE

## 2014-08-03 ENCOUNTER — Ambulatory Visit (INDEPENDENT_AMBULATORY_CARE_PROVIDER_SITE_OTHER): Payer: Medicare Other | Admitting: Vascular Surgery

## 2014-08-10 ENCOUNTER — Ambulatory Visit (INDEPENDENT_AMBULATORY_CARE_PROVIDER_SITE_OTHER): Payer: Self-pay | Admitting: Vascular Surgery

## 2014-08-10 ENCOUNTER — Ambulatory Visit (INDEPENDENT_AMBULATORY_CARE_PROVIDER_SITE_OTHER): Payer: Medicare Other | Admitting: FAMILY PRACTICE

## 2014-08-10 VITALS — BP 142/68 | HR 60 | Temp 97.1°F | Resp 20 | Ht 63.0 in | Wt 153.2 lb

## 2014-08-10 DIAGNOSIS — I69928 Other speech and language deficits following unspecified cerebrovascular disease: Secondary | ICD-10-CM

## 2014-08-10 DIAGNOSIS — E785 Hyperlipidemia, unspecified: Secondary | ICD-10-CM

## 2014-08-10 DIAGNOSIS — N189 Chronic kidney disease, unspecified: Secondary | ICD-10-CM

## 2014-08-10 DIAGNOSIS — F39 Unspecified mood [affective] disorder: Secondary | ICD-10-CM

## 2014-08-10 DIAGNOSIS — Z23 Encounter for immunization: Secondary | ICD-10-CM

## 2014-08-10 DIAGNOSIS — B356 Tinea cruris: Secondary | ICD-10-CM

## 2014-08-10 DIAGNOSIS — I10 Essential (primary) hypertension: Secondary | ICD-10-CM

## 2014-08-10 DIAGNOSIS — E119 Type 2 diabetes mellitus without complications: Secondary | ICD-10-CM

## 2014-08-10 DIAGNOSIS — Z951 Presence of aortocoronary bypass graft: Secondary | ICD-10-CM

## 2014-08-10 DIAGNOSIS — I69328 Other speech and language deficits following cerebral infarction: Secondary | ICD-10-CM

## 2014-08-10 MED ORDER — CLOTRIMAZOLE-BETAMETHASONE 1 %-0.05 % TOPICAL CREAM
TOPICAL_CREAM | Freq: Two times a day (BID) | CUTANEOUS | Status: AC
Start: 2014-08-10 — End: ?

## 2014-08-10 NOTE — Progress Notes (Signed)
[facility]  [address]  [place] [address]  [phone]        Encounter Date: 08/10/2014 11:20 AM EDT      Name: Harry Rowland  Age: 80 y.o.  DOB: 1924/04/14  Sex: male    Chief Complaint:   Chief Complaint   Patient presents with    Follow Up Dm (Diabetes Mellitus)    Falling     falling every other month    Other     cyst penis .  urologist 08/31/14       HPI  Harry Rowland, a pleasant 81 y/o male, presents to clinic with his daughter for DM II follow up.  FS running <200.  Diet not as good, eating more treats lately.  Taking medications as directed.  Following with physical therapy to help with strength and balance.  Falls almost every other month.  Last episode in the laundry room unsure what happened, time before fell on a blanket that fell off the bed.      Seen by nephrology recently and complained about lesion on penis.  Dr. Krista Blue referred him to urology without examining it.  Daughter would like me to look at the area and decide if referral needs to be placed.     Specialists  Cardiology in [place]  Ortho Dr. Arlie Solomons  Nephrology Dr. Krista Blue    Review of Systems   Respiratory: Negative.    Cardiovascular: Negative.    Gastrointestinal: Negative.    Genitourinary: Negative.    Musculoskeletal: Positive for arthralgias.   Skin: Negative.    Neurological: Positive for weakness and headaches.   Psychiatric/Behavioral: Negative.    All other systems reviewed and are negative.      Current Outpatient Prescriptions   Medication Sig    acetaminophen (TYLENOL) 325 mg Oral Tablet Take 650 mg by mouth Every 4 hours as needed    atorvastatin (LIPITOR) 40 mg Oral Tablet Take 1 Tab (40 mg total) by mouth Every evening    CALCIUM CARBONATE/VITAMIN D3 (CALCARB 600 WITH VITAMIN D ORAL) Take by mouth    clotrimazole-betamethasone (LOTRISONE) 1-0.05 % Cream Apply topically Twice daily    CONTOUR NEXT STRIPS Strip TEST 3 TIMES A DAY    ferrous sulfate (FEOSOL)  325 mg (65 mg iron) Oral Tablet TAKE 1 TABLET BY MOUTH TWICE DAILY    hydrocortisone (CORTIZONE-10) 1 % Apply externally Cream by Apply Topically route Twice per day as needed    insulin glargine (LANTUS SOLOSTAR) 100 unit/mL Subcutaneous Insulin Pen 5 Units by Subcutaneous route Every night    Irbesartan (AVAPRO) 75 mg Oral Tablet TAKE ONE TABLET BY MOUTH EVERY DAY    Lancets Does not apply Misc 1 Units by Does not apply route Twice daily    metoprolol succinate (TOPROL XL) 50 mg Oral Tablet Sustained Release 24 hr Take 1 Tab (50 mg total) by mouth Once a day    MULTIVITAMINS WITH FLUORIDE (MULTI-VITAMIN PO) Take by mouth    naproxen (NAPROSYN) 500 mg Oral Tablet Take 500 mg by mouth Once a day    nitroglycerin (NITROSTAT) 0.4 mg Sublingual Tablet, Sublingual 1 Tab (0.4 mg total) by Sublingual route Every 5 minutes as needed for 3 doses    polyethylene glycol (MIRALAX) 17 gram Oral Powder in Packet Take 17 g by mouth Once a day    STERILANCE TL 30 gauge Misc USE TWICE DAILY    VIT A/VIT C/VIT E/ZINC/COPPER (PRESERVISION AREDS ORAL)  Take by mouth       Examination  Vitals: BP 142/68 mmHg   Pulse 60   Temp(Src) 36.2 C (97.1 F) (Tympanic)   Resp 20   Ht 1.6 m ( )   Wt 69.491 kg (153 lb 3.2 oz)   BMI 27.14 kg/m2   SpO2 97%    Physical Exam   Constitutional: He is oriented to person, place, and time. He appears well-developed and well-nourished.   HENT:   Head: Normocephalic and atraumatic.   Nose: Nose normal.   Mouth/Throat: Oropharynx is clear and moist.   Eyes: Conjunctivae are normal.   Neck: Neck supple.   Cardiovascular: Normal rate and regular rhythm.    Pulmonary/Chest: Effort normal and breath sounds normal. No respiratory distress. He has no wheezes.   Abdominal: Soft. He exhibits no distension. There is no tenderness.   Genitourinary: Penis normal. No penile tenderness.   Musculoskeletal: He exhibits edema.   Neurological: He is alert and oriented to person, place, and time.   Skin: Skin is  warm and dry.   Psychiatric: He has a normal mood and affect.   Nursing note and vitals reviewed.      Ortho Exam    Assessment and Plan    Mamadou was seen today for follow up dm (diabetes mellitus), falling and other.    Diabetes mellitus  Essential hypertension  Hyperlipidemia LDL goal <70  CVA, old, speech/language deficit  S/P CABG x 2   Stable, monitor labs.  Continue current medications.  Continue follow up with cardiology   Annual carotid u/s to monitor right CAD.     Chronic kidney disease   Stable, continue to monitor.  Following with specialist.     Mood disorder   No medication at this time.      Tinea cruris   Start lotrisone to area BID.  Keep clean and dry.     Other Orders  - clotrimazole-betamethasone (LOTRISONE) 1-0.05 % Cream; Apply topically Twice daily        Return in about 6 months (around 02/08/2015), or if symptoms worsen or fail to improve, for f/u .      [name], DO

## 2014-08-31 ENCOUNTER — Ambulatory Visit (INDEPENDENT_AMBULATORY_CARE_PROVIDER_SITE_OTHER): Payer: Self-pay | Admitting: Physician Assistant

## 2014-09-22 ENCOUNTER — Telehealth (INDEPENDENT_AMBULATORY_CARE_PROVIDER_SITE_OTHER): Payer: Self-pay

## 2014-09-22 ENCOUNTER — Ambulatory Visit (INDEPENDENT_AMBULATORY_CARE_PROVIDER_SITE_OTHER): Payer: Self-pay

## 2014-09-22 ENCOUNTER — Ambulatory Visit (INDEPENDENT_AMBULATORY_CARE_PROVIDER_SITE_OTHER): Payer: Medicare Other

## 2014-09-22 VITALS — Temp 97.7°F

## 2014-09-22 DIAGNOSIS — J3489 Other specified disorders of nose and nasal sinuses: Secondary | ICD-10-CM

## 2014-09-22 DIAGNOSIS — J029 Acute pharyngitis, unspecified: Secondary | ICD-10-CM

## 2014-09-22 MED ORDER — SULFAMETHOXAZOLE 800 MG-TRIMETHOPRIM 160 MG TABLET
1.00 | ORAL_TABLET | Freq: Two times a day (BID) | ORAL | Status: AC
Start: 2014-09-22 — End: 2014-09-29

## 2014-09-22 NOTE — Telephone Encounter (Signed)
Pt put on Nurse vst, for Strep test, Per Dr Grayland Jackomes

## 2014-09-22 NOTE — Telephone Encounter (Signed)
PC from pt daughter Alexia Freestoneatty, c/o  Sore throat and possible fever, did not check it but he feels warm. His care giver had strep last week. She would like him seen today.

## 2014-09-22 NOTE — Progress Notes (Signed)
Patient came to clinic, c/o sore throat and sinus drainage that makes him cough, mainly at night. Strep test neg, Thick clear mucus in throat. Per Dr Grayland Jackomes start Bactrim bid, Nasonex 1 spray each nostril, Claritin 10 mg daily OTC. Explained all to Daughter, she verbalized understanding. She will be leaving to go home on Sat. She will call tomorrow if symptoms worsen. Rx sent to [facility].

## 2014-09-24 ENCOUNTER — Emergency Department (EMERGENCY_DEPARTMENT_HOSPITAL): Payer: Medicare Other

## 2014-09-24 ENCOUNTER — Emergency Department (EMERGENCY_DEPARTMENT_HOSPITAL)
Admission: EM | Admit: 2014-09-24 | Discharge: 2014-09-25 | Payer: Medicare Other | Attending: Family | Admitting: Family

## 2014-09-24 DIAGNOSIS — S3289XA Fracture of other parts of pelvis, initial encounter for closed fracture: Secondary | ICD-10-CM

## 2014-10-17 ENCOUNTER — Telehealth (INDEPENDENT_AMBULATORY_CARE_PROVIDER_SITE_OTHER): Payer: Self-pay

## 2014-10-17 NOTE — Telephone Encounter (Signed)
PC from pt daughter, requesting a hospital F/U. She feels the time may have come, that  her dad should move back in with her. She has discussed this with him since being d/c from hospital,he becomes very angry and upset. She doesn't feel he should be alone ALL night anymore. When to sched?

## 2014-10-17 NOTE — Telephone Encounter (Signed)
Schedule this wednesday

## 2014-10-17 NOTE — Telephone Encounter (Signed)
[facility] appt date and time, to call w./ any questions.

## 2014-10-19 ENCOUNTER — Ambulatory Visit (INDEPENDENT_AMBULATORY_CARE_PROVIDER_SITE_OTHER): Payer: Medicare Other | Admitting: FAMILY PRACTICE

## 2014-10-19 VITALS — BP 114/62 | HR 92 | Temp 97.3°F | Resp 20 | Ht 63.0 in | Wt 141.2 lb

## 2014-10-19 DIAGNOSIS — F39 Unspecified mood [affective] disorder: Secondary | ICD-10-CM

## 2014-10-19 DIAGNOSIS — Z09 Encounter for follow-up examination after completed treatment for conditions other than malignant neoplasm: Secondary | ICD-10-CM

## 2014-10-19 DIAGNOSIS — S329XXD Fracture of unspecified parts of lumbosacral spine and pelvis, subsequent encounter for fracture with routine healing: Secondary | ICD-10-CM

## 2014-10-19 MED ORDER — LORAZEPAM 0.5 MG TABLET
0.50 mg | ORAL_TABLET | Freq: Every evening | ORAL | Status: AC | PRN
Start: 2014-10-19 — End: ?
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
## 2014-10-21 ENCOUNTER — Ambulatory Visit (INDEPENDENT_AMBULATORY_CARE_PROVIDER_SITE_OTHER): Payer: Medicare Other | Admitting: Clinical Medical Laboratory

## 2014-10-21 DIAGNOSIS — R350 Frequency of micturition: Principal | ICD-10-CM

## 2014-10-21 LAB — POCT URINE DIPSTICK
BILIRUBIN: NEGATIVE
BLOOD: NEGATIVE
GLUCOSE: NEGATIVE
KETONE: NEGATIVE
LEUKOCYTES: NEGATIVE
NITRITE: NEGATIVE
PH: 5
PROTEIN: NEGATIVE
SPECIFIC GRAVITY: 1.02
UROBILINOGEN: NEGATIVE

## 2014-10-21 NOTE — Progress Notes (Signed)
POCT UA and culture

## 2014-11-07 ENCOUNTER — Other Ambulatory Visit (INDEPENDENT_AMBULATORY_CARE_PROVIDER_SITE_OTHER): Payer: Self-pay | Admitting: Family Medicine

## 2014-11-09 ENCOUNTER — Telehealth (INDEPENDENT_AMBULATORY_CARE_PROVIDER_SITE_OTHER): Payer: Self-pay

## 2014-11-09 DIAGNOSIS — E119 Type 2 diabetes mellitus without complications: Secondary | ICD-10-CM

## 2014-11-17 ENCOUNTER — Other Ambulatory Visit (INDEPENDENT_AMBULATORY_CARE_PROVIDER_SITE_OTHER): Payer: Self-pay

## 2014-11-17 MED ORDER — DIVALPROEX 250 MG TABLET,DELAYED RELEASE
DELAYED_RELEASE_TABLET | ORAL | Status: AC
Start: 2014-11-17 — End: ?

## 2014-12-05 ENCOUNTER — Ambulatory Visit (INDEPENDENT_AMBULATORY_CARE_PROVIDER_SITE_OTHER): Payer: Medicare PPO | Admitting: Podiatry

## 2014-12-05 ENCOUNTER — Encounter: Payer: Self-pay | Admitting: Podiatry

## 2014-12-05 DIAGNOSIS — L97521 Non-pressure chronic ulcer of other part of left foot limited to breakdown of skin: Secondary | ICD-10-CM

## 2014-12-05 DIAGNOSIS — M79606 Pain in leg, unspecified: Secondary | ICD-10-CM | POA: Insufficient documentation

## 2014-12-05 DIAGNOSIS — B351 Tinea unguium: Secondary | ICD-10-CM

## 2014-12-05 DIAGNOSIS — L97529 Non-pressure chronic ulcer of other part of left foot with unspecified severity: Secondary | ICD-10-CM | POA: Insufficient documentation

## 2014-12-05 DIAGNOSIS — M79605 Pain in left leg: Secondary | ICD-10-CM

## 2014-12-05 NOTE — Patient Instructions (Signed)
Seen for hypertrophic nails. Noted of pre ulcerative 5th digit left. Use dispensed pad as needed. All nails debrided. Return in 3 months or as needed.

## 2014-12-05 NOTE — Progress Notes (Signed)
Objective: 81 year old male presents accompanied by his daughter with problematic toe nails and pain in left foot. Patient is wearing compression socks and regular socks.  Objective: Neurovascular status are within normal. Severe contracted lesser digits bilateral. Pain in 4th digit left foot with macerated skin.  Hypertrophic yellow nails x 10.  Assessment: Severe hammer toe lesser digits bilateral. Pre ulcerative skin 4th digit left. Mycotic nails x 10. Pain in left 4th toe.  Plan: Debrided all nails.  1/4" felt pad toe spacer placed at 3rd interspace. Sample supply dispensed to daughter.  Return in 3 months or sooner as needed.

## 2014-12-22 ENCOUNTER — Other Ambulatory Visit: Payer: Self-pay | Admitting: Physician Assistant

## 2014-12-22 ENCOUNTER — Ambulatory Visit
Admission: RE | Admit: 2014-12-22 | Discharge: 2014-12-22 | Disposition: A | Discharge status: Home / Self Care | Payer: Medicare PPO | Source: Ambulatory Visit | Attending: Physician Assistant | Admitting: Physician Assistant

## 2014-12-22 DIAGNOSIS — R0789 Other chest pain: Secondary | ICD-10-CM

## 2014-12-28 ENCOUNTER — Ambulatory Visit: Payer: Medicare PPO | Admitting: Podiatry

## 2015-02-09 ENCOUNTER — Encounter (INDEPENDENT_AMBULATORY_CARE_PROVIDER_SITE_OTHER): Payer: Self-pay | Admitting: FAMILY PRACTICE

## 2015-02-23 ENCOUNTER — Encounter (INDEPENDENT_AMBULATORY_CARE_PROVIDER_SITE_OTHER): Payer: Self-pay | Admitting: FAMILY PRACTICE

## 2015-03-01 ENCOUNTER — Encounter: Payer: Self-pay | Admitting: *Deleted

## 2015-03-06 ENCOUNTER — Ambulatory Visit: Payer: Medicare PPO | Admitting: Podiatry

## 2015-03-14 ENCOUNTER — Ambulatory Visit (INDEPENDENT_AMBULATORY_CARE_PROVIDER_SITE_OTHER): Payer: Medicare PPO | Admitting: Podiatry

## 2015-03-14 ENCOUNTER — Encounter: Payer: Self-pay | Admitting: Podiatry

## 2015-03-14 VITALS — BP 145/67 | HR 65

## 2015-03-14 DIAGNOSIS — L97521 Non-pressure chronic ulcer of other part of left foot limited to breakdown of skin: Secondary | ICD-10-CM

## 2015-03-14 DIAGNOSIS — M79606 Pain in leg, unspecified: Secondary | ICD-10-CM

## 2015-03-14 DIAGNOSIS — B351 Tinea unguium: Secondary | ICD-10-CM

## 2015-03-14 NOTE — Progress Notes (Signed)
Objective: 81 year old male presents accompanied by his daughter with problematic toe nails and pain in left foot.  Objective: Neurovascular status are within normal. Severe contracted lesser digits bilateral. Pre ulcerative skin lesion at medial aspect of 4th digit left foot.  Hypertrophic yellow nails x 10. Reddened distal end of left hallux with draining nail bed. Hyperextended left great toe.  Assessment: Severe hammer toe lesser digits bilateral. Pre ulcerative skin 4th digit left. Ulcerating nail left hallux.  Mycotic nails x 10.  Plan: Debrided all nails.  1/4" felt pad toe spacer placed at 3rd interspace. Supply dispensed to daughter.  Cut and opened toe box on shoe left foot to accommodate hyperextended hallux and to prevent further skin break down.  Return in one month for follow up.

## 2015-03-14 NOTE — Patient Instructions (Signed)
Seen for routine foot care. Noted of pre ulcerative lesion on left 1st and 4th digits. Padded and modified shoe. Return in one month.

## 2015-04-06 ENCOUNTER — Encounter (HOSPITAL_COMMUNITY): Payer: Self-pay | Admitting: Emergency Medicine

## 2015-04-06 ENCOUNTER — Emergency Department (HOSPITAL_COMMUNITY): Payer: Medicare PPO

## 2015-04-06 ENCOUNTER — Emergency Department (HOSPITAL_COMMUNITY)
Admission: EM | Admit: 2015-04-06 | Discharge: 2015-04-06 | Disposition: A | Discharge status: Home / Self Care | Payer: Medicare PPO | Attending: Emergency Medicine | Admitting: Emergency Medicine

## 2015-04-06 DIAGNOSIS — R109 Unspecified abdominal pain: Secondary | ICD-10-CM | POA: Insufficient documentation

## 2015-04-06 DIAGNOSIS — Z79899 Other long term (current) drug therapy: Secondary | ICD-10-CM | POA: Diagnosis not present

## 2015-04-06 DIAGNOSIS — I2510 Atherosclerotic heart disease of native coronary artery without angina pectoris: Secondary | ICD-10-CM | POA: Insufficient documentation

## 2015-04-06 DIAGNOSIS — Z7982 Long term (current) use of aspirin: Secondary | ICD-10-CM | POA: Insufficient documentation

## 2015-04-06 DIAGNOSIS — R079 Chest pain, unspecified: Secondary | ICD-10-CM | POA: Diagnosis present

## 2015-04-06 DIAGNOSIS — E780 Pure hypercholesterolemia: Secondary | ICD-10-CM | POA: Insufficient documentation

## 2015-04-06 DIAGNOSIS — Z951 Presence of aortocoronary bypass graft: Secondary | ICD-10-CM | POA: Insufficient documentation

## 2015-04-06 DIAGNOSIS — Z8781 Personal history of (healed) traumatic fracture: Secondary | ICD-10-CM | POA: Diagnosis not present

## 2015-04-06 DIAGNOSIS — Z880 Allergy status to penicillin: Secondary | ICD-10-CM | POA: Insufficient documentation

## 2015-04-06 DIAGNOSIS — Z794 Long term (current) use of insulin: Secondary | ICD-10-CM | POA: Insufficient documentation

## 2015-04-06 DIAGNOSIS — Z8673 Personal history of transient ischemic attack (TIA), and cerebral infarction without residual deficits: Secondary | ICD-10-CM | POA: Insufficient documentation

## 2015-04-06 DIAGNOSIS — M1990 Unspecified osteoarthritis, unspecified site: Secondary | ICD-10-CM | POA: Diagnosis not present

## 2015-04-06 DIAGNOSIS — I10 Essential (primary) hypertension: Secondary | ICD-10-CM | POA: Insufficient documentation

## 2015-04-06 DIAGNOSIS — F0390 Unspecified dementia without behavioral disturbance: Secondary | ICD-10-CM | POA: Diagnosis not present

## 2015-04-06 DIAGNOSIS — E119 Type 2 diabetes mellitus without complications: Secondary | ICD-10-CM | POA: Diagnosis not present

## 2015-04-06 DIAGNOSIS — Z87891 Personal history of nicotine dependence: Secondary | ICD-10-CM | POA: Diagnosis not present

## 2015-04-06 DIAGNOSIS — G8929 Other chronic pain: Secondary | ICD-10-CM | POA: Diagnosis not present

## 2015-04-06 DIAGNOSIS — R0789 Other chest pain: Secondary | ICD-10-CM | POA: Diagnosis not present

## 2015-04-06 LAB — COMPREHENSIVE METABOLIC PANEL
ALBUMIN: 3.9 g/dL (ref 3.5–5.0)
ALT: 13 U/L — ABNORMAL LOW (ref 17–63)
ANION GAP: 6 (ref 5–15)
AST: 21 U/L (ref 15–41)
Alkaline Phosphatase: 84 U/L (ref 38–126)
BILIRUBIN TOTAL: 0.7 mg/dL (ref 0.3–1.2)
BUN: 35 mg/dL — AB (ref 6–20)
CHLORIDE: 109 mmol/L (ref 101–111)
CO2: 24 mmol/L (ref 22–32)
Calcium: 8.6 mg/dL — ABNORMAL LOW (ref 8.9–10.3)
Creatinine, Ser: 1.49 mg/dL — ABNORMAL HIGH (ref 0.61–1.24)
GFR calc Af Amer: 46 mL/min — ABNORMAL LOW (ref >60)
GFR, EST NON AFRICAN AMERICAN: 40 mL/min — AB (ref >60)
Glucose, Bld: 149 mg/dL — ABNORMAL HIGH (ref 70–99)
POTASSIUM: 4.2 mmol/L (ref 3.5–5.1)
Sodium: 139 mmol/L (ref 135–145)
Total Protein: 6.1 g/dL — ABNORMAL LOW (ref 6.5–8.1)

## 2015-04-06 LAB — CBC WITH DIFFERENTIAL/PLATELET
BASOS PCT: 0 % (ref 0–1)
Basophils Absolute: 0 10*3/uL (ref 0.0–0.1)
EOS ABS: 0.2 10*3/uL (ref 0.0–0.7)
EOS PCT: 3 % (ref 0–5)
HCT: 37.1 % — ABNORMAL LOW (ref 39.0–52.0)
Hemoglobin: 11.9 g/dL — ABNORMAL LOW (ref 13.0–17.0)
LYMPHS ABS: 1.9 10*3/uL (ref 0.7–4.0)
Lymphocytes Relative: 25 % (ref 12–46)
MCH: 31.6 pg (ref 26.0–34.0)
MCHC: 32.1 g/dL (ref 30.0–36.0)
MCV: 98.4 fL (ref 78.0–100.0)
MONOS PCT: 10 % (ref 3–12)
Monocytes Absolute: 0.7 10*3/uL (ref 0.1–1.0)
Neutro Abs: 4.8 10*3/uL (ref 1.7–7.7)
Neutrophils Relative %: 62 % (ref 43–77)
PLATELETS: 214 10*3/uL (ref 150–400)
RBC: 3.77 MIL/uL — ABNORMAL LOW (ref 4.22–5.81)
RDW: 13.5 % (ref 11.5–15.5)
WBC: 7.6 10*3/uL (ref 4.0–10.5)

## 2015-04-06 LAB — URINALYSIS, ROUTINE W REFLEX MICROSCOPIC
Bilirubin Urine: NEGATIVE
Glucose, UA: NEGATIVE mg/dL
Hgb urine dipstick: NEGATIVE
KETONES UR: NEGATIVE mg/dL
Leukocytes, UA: NEGATIVE
Nitrite: NEGATIVE
Protein, ur: NEGATIVE mg/dL
SPECIFIC GRAVITY, URINE: 1.02 (ref 1.005–1.030)
UROBILINOGEN UA: 0.2 mg/dL (ref 0.0–1.0)
pH: 5.5 (ref 5.0–8.0)

## 2015-04-06 MED ORDER — TRAMADOL HCL 50 MG PO TABS
50.0000 mg | ORAL_TABLET | Freq: Once | ORAL | Status: AC
  Administered 2015-04-06: 50 mg via ORAL
  Filled 2015-04-06: qty 1

## 2015-04-06 MED ORDER — TRAMADOL HCL 50 MG PO TABS: 50.0000 mg | ORAL_TABLET | Freq: Four times a day (QID) | ORAL | Status: DC | PRN

## 2015-04-06 MED ORDER — IBUPROFEN 200 MG PO TABS
400.0000 mg | ORAL_TABLET | Freq: Once | ORAL | Status: AC
  Administered 2015-04-06: 400 mg via ORAL
  Filled 2015-04-06: qty 2

## 2015-04-06 NOTE — ED Notes (Addendum)
Per EMS: pt c/o right rib pain and flank pain x 2 days has some congestion with cough. Pt had pelvic fracture back in October of last year.

## 2015-04-06 NOTE — ED Notes (Signed)
Bed: WA03 Expected date:  Expected time:  Means of arrival:  Comments: EMS 81 yo male from home/right rib cage frame/hx fractures-coughing x 2 days

## 2015-04-06 NOTE — Discharge Instructions (Signed)
Chest x-ray today showed no pneumonia, no rib fractures.  Lab work showed no signs of urinary tract infection, or liver problems.  Renal function is depressed but not dangerously so.  A prescription for Ultram was provided.  You can try this.  If Tylenol or ibuprofen is not helping with pain.  Please follow-up with patient's primary care physician in 3-5 days for recheck.  Return to the emergency department for worsening condition or new concerning symptoms.   Chest Wall Pain Chest wall pain is pain in or around the bones and muscles of your chest. It may take up to 6 weeks to get better. It may take longer if you must stay physically active in your work and activities.  CAUSES  Chest wall pain may happen on its own. However, it may be caused by:  A viral illness like the flu.  Injury.  Coughing.  Exercise.  Arthritis.  Fibromyalgia.  Shingles. HOME CARE INSTRUCTIONS   Avoid overtiring physical activity. Try not to strain or perform activities that cause pain. This includes any activities using your chest or your abdominal and side muscles, especially if heavy weights are used.  Put ice on the sore area.  Put ice in a plastic bag.  Place a towel between your skin and the bag.  Leave the ice on for 15-20 minutes per hour while awake for the first 2 days.  Only take over-the-counter or prescription medicines for pain, discomfort, or fever as directed by your caregiver. SEEK IMMEDIATE MEDICAL CARE IF:   Your pain increases, or you are very uncomfortable.  You have a fever.  Your chest pain becomes worse.  You have new, unexplained symptoms.  You have nausea or vomiting.  You feel sweaty or lightheaded.  You have a cough with phlegm (sputum), or you cough up blood. MAKE SURE YOU:   Understand these instructions.  Will watch your condition.  Will get help right away if you are not doing well or get worse. Document Released: 11/18/2005 Document Revised: 02/10/2012  Document Reviewed: 07/15/2011 [facility] Patient Information 2015 [employer] [place]. This information is not intended to replace advice given to you by your health care provider. Make sure you discuss any questions you have with your health care provider.  Flank Pain Flank pain refers to pain that is located on the side of the body between the upper abdomen and the back. The pain may occur over a short period of time (acute) or may be long-term or reoccurring (chronic). It may be mild or severe. Flank pain can be caused by many things. CAUSES  Some of the more common causes of flank pain include:  Muscle strains.   Muscle spasms.   A disease of your spine (vertebral disk disease).   A lung infection (pneumonia).   Fluid around your lungs (pulmonary edema).   A kidney infection.   Kidney stones.   A very painful skin rash caused by the chickenpox virus (shingles).   Gallbladder disease.  HOME CARE INSTRUCTIONS  Home care will depend on the cause of your pain. In general,  Rest as directed by your caregiver.  Drink enough fluids to keep your urine clear or pale yellow.  Only take over-the-counter or prescription medicines as directed by your caregiver. Some medicines may help relieve the pain.  Tell your caregiver about any changes in your pain.  Follow up with your caregiver as directed. SEEK IMMEDIATE MEDICAL CARE IF:   Your pain is not controlled with medicine.  You have new or worsening symptoms.  Your pain increases.   You have abdominal pain.   You have shortness of breath.   You have persistent nausea or vomiting.   You have swelling in your abdomen.   You feel faint or pass out.   You have blood in your urine.  You have a fever or persistent symptoms for more than 2-3 days.  You have a fever and your symptoms suddenly get worse. MAKE SURE YOU:   Understand these instructions.  Will watch your condition.  Will get help right away if  you are not doing well or get worse. Document Released: 01/09/2006 Document Revised: 08/12/2012 Document Reviewed: 07/02/2012 [facility] Patient Information 2015 [place]. This information is not intended to replace advice given to you by your health care provider. Make sure you discuss any questions you have with your health care provider.

## 2015-04-06 NOTE — ED Provider Notes (Signed)
CSN: [id number]     Arrival date & time 04/06/15  0416 History   First MD Initiated Contact with Patient 04/06/15 0448     Chief Complaint  Patient presents with  . rib cage pain    . Flank Pain     (Consider location/radiation/quality/duration/timing/severity/associated sxs/prior Treatment) HPI 81 year old male presents to emergency department from home via EMS with complaint of right flank pain.  Daughter is caregiver.  She reports that patient slid out of bed on Monday, but did not complain of pain.  On Tuesday he did well, they went to lunch.  Patient became scared to enter the house after lunch and husband had to pick them up slightly and walk him over the threshold.  Since that time, he has occasionally complained of right flank pain.  No complaints on Tuesday night.  Patient did well throughout Wednesday and then around 10 PM complained of more pain.  Patient has history of diffuse arthritis, chronic right knee pain status post recent injection by his orthopedist.  He has history of pelvic fracture and rib fractures.  He has history of dementia and is a poor historian.  Daughter reports patient has chronic cough but it seems worse over the last few days.  No fevers or chills.  He is eating and drinking well.  They have been giving him Tylenol and/or ibuprofen for pain.  No treatment last 24 hours, however. Past Medical History  Diagnosis Date  . Stroke   . Diabetes mellitus without complication   . CAD (coronary artery disease)   . Hypertension   . High cholesterol   . TIA (transient ischemic attack)    Past Surgical History  Procedure Laterality Date  . Carotid endarterectomy    . Coronary artery bypass graft  05/2012  . Lumbar disc surgery  1960  . Ankle surgery  1996  . Wrist surgery  1984   Family History  Problem Relation Age of Onset  . Cancer Sister    History  Substance Use Topics  . Smoking status: Former Smoker -- 1.00 packs/day    Types: Cigarettes  . Smokeless  tobacco: Never Used  . Alcohol Use: No    Review of Systems  Unable to perform ROS: Dementia      Allergies  Ace inhibitors; Demerol; Morphine and related; Penicillins; and Tetanus toxoids  Home Medications   Prior to Admission medications   Medication Sig Start Date End Date Taking? Authorizing Provider  acetaminophen (TYLENOL) 325 MG tablet Take 650 mg by mouth Every 4 hours as needed   Yes Historical Provider, MD  alum & mag hydroxide-simeth (MAALOX/MYLANTA) 200-200-20 MG/5ML suspension Take 30 mLs by mouth every 6 (six) hours as needed for indigestion or heartburn.   Yes Historical Provider, MD  aspirin 81 MG tablet Take 81 mg by mouth daily.   Yes Historical Provider, MD  atorvastatin (LIPITOR) 40 MG tablet Take 40 mg by mouth daily.   Yes Historical Provider, MD  beta carotene w/minerals (OCUVITE) tablet Take 1 tablet by mouth daily.   Yes Historical Provider, MD  CALCIUM-VITAMIN D PO Take 1 tablet by mouth daily.   Yes Historical Provider, MD  clonazePAM (KLONOPIN) 0.5 MG tablet Take 0.25 mg by mouth at bedtime.  11/24/14  Yes Historical Provider, MD  clotrimazole-betamethasone (LOTRISONE) cream Apply topically Twice daily 08/10/14  Yes Historical Provider, MD  docusate sodium (COLACE) 100 MG capsule Take 100 mg by mouth 2 (two) times daily.   Yes Historical Provider, MD  ferrous sulfate 325 (65 FE) MG tablet Take 325 mg by mouth daily with breakfast.   Yes Historical Provider, MD  hydrocortisone cream 1 % by Apply Topically route Twice per day as needed   Yes Historical Provider, MD  ibuprofen (ADVIL,MOTRIN) 200 MG tablet Take 400 mg by mouth every 6 (six) hours as needed for moderate pain.   Yes Historical Provider, MD  Insulin Glargine (LANTUS SOLOSTAR) 100 UNIT/ML Solostar Pen 5 Units by Subcutaneous route Every night 12/23/13  Yes Historical Provider, MD  irbesartan (AVAPRO) 75 MG tablet Take 75 mg by mouth at bedtime.   Yes Historical Provider, MD  loratadine (CLARITIN) 10 MG  tablet Take 10 mg by mouth Once a day   Yes Historical Provider, MD  metoprolol succinate (TOPROL-XL) 50 MG 24 hr tablet Take 50 mg by mouth daily. Take with or immediately following a meal.   Yes Historical Provider, MD  Multiple Vitamin (MULTIVITAMIN WITH MINERALS) TABS Take 1 tablet by mouth daily.   Yes Historical Provider, MD  nitroGLYCERIN (NITROSTAT) 0.4 MG SL tablet Place 0.4 mg under the tongue every 5 (five) minutes as needed. Chest pain   Yes Historical Provider, MD  aspirin EC 325 MG EC tablet Take 1 tablet (325 mg total) by mouth daily. Patient not taking: Reported on 04/06/2015 09/14/12   Renae Fickle, MD  B-D ULTRAFINE III SHORT PEN 31G X 8 MM MISC  11/09/14   Historical Provider, MD  glucose blood (BAYER CONTOUR NEXT TEST) test strip TEST 3 TIMES A DAY 07/04/14   Historical Provider, MD  Lancets (STERILANCE TL) MISC USE TWICE DAILY 07/16/14   Historical Provider, MD   BP 137/57 mmHg  Pulse 71  Temp(Src) 97.7 F (36.5 C) (Oral)  Resp 18  SpO2 97% Physical Exam  Constitutional: He appears well-developed and well-nourished.  HENT:  Head: Normocephalic and atraumatic.  Nose: Nose normal.  Mouth/Throat: Oropharynx is clear and moist.  Eyes: Conjunctivae and EOM are normal. Pupils are equal, round, and reactive to light.  Neck: Normal range of motion. Neck supple. No JVD present. No tracheal deviation present. No thyromegaly present.  Cardiovascular: Normal rate, regular rhythm, normal heart sounds and intact distal pulses.  Exam reveals no gallop and no friction rub.   No murmur heard. Pulmonary/Chest: Effort normal and breath sounds normal. No stridor. No respiratory distress. He has no wheezes. He has no rales. He exhibits tenderness (right lower costal margin in mid axillary line).  Abdominal: Soft. Bowel sounds are normal. He exhibits no distension and no mass. There is no tenderness. There is no rebound and no guarding.  Musculoskeletal: Normal range of motion. He exhibits no  edema or tenderness.  Lymphadenopathy:    He has no cervical adenopathy.  Neurological: He is alert. He displays normal reflexes. He exhibits normal muscle tone. Coordination normal.  Skin: Skin is warm and dry. No rash noted. No erythema. There is pallor.  Psychiatric: He has a normal mood and affect. His behavior is normal.  Nursing note and vitals reviewed.   ED Course  Procedures (including critical care time) Labs Review Labs Reviewed  COMPREHENSIVE METABOLIC PANEL - Abnormal; Notable for the following:    Glucose, Bld 149 (*)    BUN 35 (*)    Creatinine, Ser 1.49 (*)    Calcium 8.6 (*)    Total Protein 6.1 (*)    ALT 13 (*)    GFR calc non Af Amer 40 (*)    GFR calc Af Denyse Dago  46 (*)    All other components within normal limits  CBC WITH DIFFERENTIAL/PLATELET - Abnormal; Notable for the following:    RBC 3.77 (*)    Hemoglobin 11.9 (*)    HCT 37.1 (*)    All other components within normal limits  URINALYSIS, ROUTINE W REFLEX MICROSCOPIC    Imaging Review Dg Chest 2 View  04/06/2015   CLINICAL DATA:  Right lateral chest wall pain.  Cough for 2 days.  EXAM: CHEST  2 VIEW  COMPARISON:  12/22/2014  FINDINGS: There is a hiatal hernia. There is moderate unchanged cardiomegaly and aortic tortuosity. The lungs are clear. The pulmonary vasculature is normal. There are no effusions.  Incidentally noted chronic override a left humeral fracture.  IMPRESSION: Cardiomegaly.  Hiatal hernia.  No acute cardiopulmonary findings.   Electronically Signed   By: Ellery Plunkaniel R Mitchell M.D.   On: 04/06/2015 05:12     EKG Interpretation None      MDM   Final diagnoses:  Right flank pain  Right-sided chest wall pain    81 yo male with right sided pain.  Pain appears to be musculoskeletal, but patient is poor historian.  Will get cxr, labs, ua.      Marisa Severinlga Trevone Prestwood, MD 04/06/15 [phone]

## 2015-04-11 ENCOUNTER — Ambulatory Visit: Payer: Medicare PPO | Admitting: Podiatry

## 2015-04-12 ENCOUNTER — Emergency Department (HOSPITAL_COMMUNITY): Payer: Medicare PPO

## 2015-04-12 ENCOUNTER — Other Ambulatory Visit (HOSPITAL_COMMUNITY): Payer: Self-pay

## 2015-04-12 ENCOUNTER — Encounter (HOSPITAL_COMMUNITY): Payer: Self-pay | Admitting: Emergency Medicine

## 2015-04-12 ENCOUNTER — Inpatient Hospital Stay (HOSPITAL_COMMUNITY)
Admission: EM | Admit: 2015-04-12 | Discharge: 2015-04-17 | DRG: 683 | Disposition: A | Discharge status: Skilled Nursing Facility | Payer: Medicare PPO | Attending: Internal Medicine | Admitting: Internal Medicine

## 2015-04-12 DIAGNOSIS — Z887 Allergy status to serum and vaccine status: Secondary | ICD-10-CM

## 2015-04-12 DIAGNOSIS — E119 Type 2 diabetes mellitus without complications: Secondary | ICD-10-CM | POA: Diagnosis present

## 2015-04-12 DIAGNOSIS — I679 Cerebrovascular disease, unspecified: Secondary | ICD-10-CM | POA: Diagnosis not present

## 2015-04-12 DIAGNOSIS — I340 Nonrheumatic mitral (valve) insufficiency: Secondary | ICD-10-CM | POA: Diagnosis present

## 2015-04-12 DIAGNOSIS — Z7982 Long term (current) use of aspirin: Secondary | ICD-10-CM | POA: Diagnosis not present

## 2015-04-12 DIAGNOSIS — Z8673 Personal history of transient ischemic attack (TIA), and cerebral infarction without residual deficits: Secondary | ICD-10-CM | POA: Diagnosis not present

## 2015-04-12 DIAGNOSIS — I639 Cerebral infarction, unspecified: Secondary | ICD-10-CM | POA: Diagnosis present

## 2015-04-12 DIAGNOSIS — I2510 Atherosclerotic heart disease of native coronary artery without angina pectoris: Secondary | ICD-10-CM | POA: Diagnosis present

## 2015-04-12 DIAGNOSIS — R627 Adult failure to thrive: Secondary | ICD-10-CM | POA: Diagnosis present

## 2015-04-12 DIAGNOSIS — Y92009 Unspecified place in unspecified non-institutional (private) residence as the place of occurrence of the external cause: Secondary | ICD-10-CM | POA: Diagnosis not present

## 2015-04-12 DIAGNOSIS — W19XXXA Unspecified fall, initial encounter: Secondary | ICD-10-CM | POA: Diagnosis present

## 2015-04-12 DIAGNOSIS — R52 Pain, unspecified: Secondary | ICD-10-CM

## 2015-04-12 DIAGNOSIS — F0391 Unspecified dementia with behavioral disturbance: Secondary | ICD-10-CM | POA: Diagnosis present

## 2015-04-12 DIAGNOSIS — Z66 Do not resuscitate: Secondary | ICD-10-CM | POA: Diagnosis present

## 2015-04-12 DIAGNOSIS — F03918 Unspecified dementia, unspecified severity, with other behavioral disturbance: Secondary | ICD-10-CM | POA: Diagnosis present

## 2015-04-12 DIAGNOSIS — S2241XA Multiple fractures of ribs, right side, initial encounter for closed fracture: Secondary | ICD-10-CM | POA: Diagnosis present

## 2015-04-12 DIAGNOSIS — Z87891 Personal history of nicotine dependence: Secondary | ICD-10-CM

## 2015-04-12 DIAGNOSIS — E780 Pure hypercholesterolemia: Secondary | ICD-10-CM | POA: Diagnosis present

## 2015-04-12 DIAGNOSIS — R531 Weakness: Secondary | ICD-10-CM | POA: Diagnosis not present

## 2015-04-12 DIAGNOSIS — E860 Dehydration: Secondary | ICD-10-CM | POA: Diagnosis present

## 2015-04-12 DIAGNOSIS — I10 Essential (primary) hypertension: Secondary | ICD-10-CM | POA: Diagnosis present

## 2015-04-12 DIAGNOSIS — I6789 Other cerebrovascular disease: Secondary | ICD-10-CM | POA: Diagnosis present

## 2015-04-12 DIAGNOSIS — Z885 Allergy status to narcotic agent status: Secondary | ICD-10-CM

## 2015-04-12 DIAGNOSIS — E875 Hyperkalemia: Secondary | ICD-10-CM | POA: Diagnosis present

## 2015-04-12 DIAGNOSIS — N179 Acute kidney failure, unspecified: Secondary | ICD-10-CM | POA: Diagnosis not present

## 2015-04-12 DIAGNOSIS — E785 Hyperlipidemia, unspecified: Secondary | ICD-10-CM | POA: Diagnosis present

## 2015-04-12 DIAGNOSIS — S2249XA Multiple fractures of ribs, unspecified side, initial encounter for closed fracture: Secondary | ICD-10-CM | POA: Diagnosis present

## 2015-04-12 DIAGNOSIS — Z880 Allergy status to penicillin: Secondary | ICD-10-CM | POA: Diagnosis not present

## 2015-04-12 DIAGNOSIS — Z951 Presence of aortocoronary bypass graft: Secondary | ICD-10-CM | POA: Diagnosis not present

## 2015-04-12 DIAGNOSIS — S2239XA Fracture of one rib, unspecified side, initial encounter for closed fracture: Secondary | ICD-10-CM

## 2015-04-12 DIAGNOSIS — W19XXXD Unspecified fall, subsequent encounter: Secondary | ICD-10-CM

## 2015-04-12 LAB — CBC WITH DIFFERENTIAL/PLATELET
Basophils Absolute: 0 10*3/uL (ref 0.0–0.1)
Basophils Relative: 1 % (ref 0–1)
Eosinophils Absolute: 0.2 10*3/uL (ref 0.0–0.7)
Eosinophils Relative: 3 % (ref 0–5)
HCT: 36.9 % — ABNORMAL LOW (ref 39.0–52.0)
Hemoglobin: 12 g/dL — ABNORMAL LOW (ref 13.0–17.0)
Lymphocytes Relative: 24 % (ref 12–46)
Lymphs Abs: 1.6 10*3/uL (ref 0.7–4.0)
MCH: 31.4 pg (ref 26.0–34.0)
MCHC: 32.5 g/dL (ref 30.0–36.0)
MCV: 96.6 fL (ref 78.0–100.0)
Monocytes Absolute: 0.5 10*3/uL (ref 0.1–1.0)
Monocytes Relative: 8 % (ref 3–12)
Neutro Abs: 4.2 10*3/uL (ref 1.7–7.7)
Neutrophils Relative %: 64 % (ref 43–77)
Platelets: 236 10*3/uL (ref 150–400)
RBC: 3.82 MIL/uL — ABNORMAL LOW (ref 4.22–5.81)
RDW: 13.3 % (ref 11.5–15.5)
WBC: 6.6 10*3/uL (ref 4.0–10.5)

## 2015-04-12 LAB — BASIC METABOLIC PANEL
Anion gap: 8 (ref 5–15)
BUN: 25 mg/dL — ABNORMAL HIGH (ref 6–20)
CO2: 24 mmol/L (ref 22–32)
Calcium: 8.8 mg/dL — ABNORMAL LOW (ref 8.9–10.3)
Chloride: 106 mmol/L (ref 101–111)
Creatinine, Ser: 1.41 mg/dL — ABNORMAL HIGH (ref 0.61–1.24)
GFR calc Af Amer: 50 mL/min — ABNORMAL LOW (ref >60)
GFR calc non Af Amer: 43 mL/min — ABNORMAL LOW (ref >60)
Glucose, Bld: 206 mg/dL — ABNORMAL HIGH (ref 70–99)
Potassium: 5.2 mmol/L — ABNORMAL HIGH (ref 3.5–5.1)
Sodium: 138 mmol/L (ref 135–145)

## 2015-04-12 MED ORDER — ASPIRIN EC 81 MG PO TBEC
81.0000 mg | DELAYED_RELEASE_TABLET | Freq: Every day | ORAL | Status: DC
  Administered 2015-04-14 – 2015-04-17 (×4): 81 mg via ORAL
  Filled 2015-04-12 (×4): qty 1

## 2015-04-12 MED ORDER — HALOPERIDOL LACTATE 5 MG/ML IJ SOLN
1.0000 mg | Freq: Two times a day (BID) | INTRAMUSCULAR | Status: DC | PRN
  Administered 2015-04-13 – 2015-04-16 (×4): 1 mg via INTRAVENOUS
  Filled 2015-04-12 (×5): qty 1

## 2015-04-12 MED ORDER — SODIUM CHLORIDE 0.9 % IV SOLN
INTRAVENOUS | Status: DC
  Administered 2015-04-15 – 2015-04-17 (×3): via INTRAVENOUS

## 2015-04-12 MED ORDER — ACETAMINOPHEN 650 MG RE SUPP: 650.0000 mg | Freq: Four times a day (QID) | RECTAL | Status: DC | PRN

## 2015-04-12 MED ORDER — ONDANSETRON HCL 4 MG/2ML IJ SOLN: 4.0000 mg | Freq: Four times a day (QID) | INTRAMUSCULAR | Status: DC | PRN

## 2015-04-12 MED ORDER — HEPARIN SODIUM (PORCINE) 5000 UNIT/ML IJ SOLN
5000.0000 [IU] | Freq: Three times a day (TID) | INTRAMUSCULAR | Status: DC
  Administered 2015-04-13 – 2015-04-17 (×11): 5000 [IU] via SUBCUTANEOUS
  Filled 2015-04-12 (×11): qty 1

## 2015-04-12 MED ORDER — ACETAMINOPHEN 325 MG PO TABS: 650.0000 mg | ORAL_TABLET | Freq: Four times a day (QID) | ORAL | Status: DC | PRN

## 2015-04-12 MED ORDER — TRAMADOL-ACETAMINOPHEN 37.5-325 MG PO TABS
1.0000 | ORAL_TABLET | Freq: Three times a day (TID) | ORAL | Status: DC | PRN
  Administered 2015-04-13 – 2015-04-17 (×5): 1 via ORAL
  Filled 2015-04-12 (×6): qty 1

## 2015-04-12 MED ORDER — LORAZEPAM 2 MG/ML IJ SOLN
0.5000 mg | Freq: Once | INTRAMUSCULAR | Status: AC
  Administered 2015-04-12: 0.5 mg via INTRAMUSCULAR
  Filled 2015-04-12: qty 1

## 2015-04-12 MED ORDER — TRAMADOL HCL 50 MG PO TABS
50.0000 mg | ORAL_TABLET | Freq: Once | ORAL | Status: AC
  Administered 2015-04-12: 50 mg via ORAL
  Filled 2015-04-12: qty 1

## 2015-04-12 MED ORDER — CLONAZEPAM 0.5 MG PO TABS
0.2500 mg | ORAL_TABLET | Freq: Three times a day (TID) | ORAL | Status: DC | PRN
  Filled 2015-04-12: qty 1

## 2015-04-12 MED ORDER — ONDANSETRON HCL 4 MG PO TABS: 4.0000 mg | ORAL_TABLET | Freq: Four times a day (QID) | ORAL | Status: DC | PRN

## 2015-04-12 MED ORDER — DOCUSATE SODIUM 100 MG PO CAPS
100.0000 mg | ORAL_CAPSULE | Freq: Two times a day (BID) | ORAL | Status: DC
  Administered 2015-04-13 – 2015-04-17 (×5): 100 mg via ORAL
  Filled 2015-04-12 (×6): qty 1

## 2015-04-12 MED ORDER — LORATADINE 10 MG PO TABS
10.0000 mg | ORAL_TABLET | Freq: Every day | ORAL | Status: DC
  Administered 2015-04-15 – 2015-04-17 (×3): 10 mg via ORAL
  Filled 2015-04-12 (×3): qty 1

## 2015-04-12 MED ORDER — INSULIN ASPART 100 UNIT/ML ~~LOC~~ SOLN
0.0000 [IU] | Freq: Three times a day (TID) | SUBCUTANEOUS | Status: DC
  Administered 2015-04-13: 1 [IU] via SUBCUTANEOUS
  Administered 2015-04-13: 2 [IU] via SUBCUTANEOUS
  Administered 2015-04-14: 1 [IU] via SUBCUTANEOUS
  Administered 2015-04-14: 3 [IU] via SUBCUTANEOUS
  Administered 2015-04-15 (×2): 1 [IU] via SUBCUTANEOUS
  Administered 2015-04-15 – 2015-04-16 (×3): 2 [IU] via SUBCUTANEOUS
  Administered 2015-04-17: 1 [IU] via SUBCUTANEOUS

## 2015-04-12 MED ORDER — ATORVASTATIN CALCIUM 40 MG PO TABS
40.0000 mg | ORAL_TABLET | Freq: Every day | ORAL | Status: DC
  Administered 2015-04-14 – 2015-04-17 (×4): 40 mg via ORAL
  Filled 2015-04-12 (×4): qty 1

## 2015-04-12 MED ORDER — METOPROLOL SUCCINATE ER 50 MG PO TB24
50.0000 mg | ORAL_TABLET | Freq: Every day | ORAL | Status: DC
  Administered 2015-04-14 – 2015-04-17 (×4): 50 mg via ORAL
  Filled 2015-04-12 (×4): qty 1

## 2015-04-12 MED ORDER — INSULIN ASPART 100 UNIT/ML ~~LOC~~ SOLN
0.0000 [IU] | Freq: Every day | SUBCUTANEOUS | Status: DC
Start: 2015-04-12 — End: 2015-04-17

## 2015-04-12 MED ORDER — HALOPERIDOL LACTATE 5 MG/ML IJ SOLN
2.0000 mg | Freq: Once | INTRAMUSCULAR | Status: DC
  Filled 2015-04-12: qty 1

## 2015-04-12 MED ORDER — FERROUS SULFATE 325 (65 FE) MG PO TABS
325.0000 mg | ORAL_TABLET | Freq: Every day | ORAL | Status: DC
  Administered 2015-04-15 – 2015-04-17 (×4): 325 mg via ORAL
  Filled 2015-04-12 (×5): qty 1

## 2015-04-12 NOTE — ED Notes (Signed)
Dr. Patel at bedside 

## 2015-04-12 NOTE — Progress Notes (Signed)
[facility].  Consult noted to be for long term acute care.  EDCM consulted [facility] SW Jody to se patient.

## 2015-04-12 NOTE — ED Notes (Signed)
[facility] with c/ confusion off and on since Sunday and not being able to ambulate as well as normal since Sunday. Pt was found by daughter Sunday sitting on floor, unsure if fall occurred or not. Pt did not have any pain or any complaints so family just helped him up. Ever since Sunday pt has been off balance and they have to use a wheelchair instead of walker like normal, he has also been having increased confusion as well. Pt had a fall yesterday and EMS was called out to but did not transport to hospital. Pt has a skin tear to left arm from the fall yesterday. Family also concerned about a mole to left upper inner arm and was going to see a dermatologist today but wanted to come to ED for eval of possible stroke. Pt has hx of previous strokes with right side weakness. Weakness to right side is normal and speech is normal as well. Pt has been leaning more to right side which is not normal.

## 2015-04-12 NOTE — ED Provider Notes (Signed)
CSN: [id number]     Arrival date & time 04/12/15  1636 History   First MD Initiated Contact with Patient 04/12/15 1638     Chief Complaint  Patient presents with  . Altered Mental Status  . Gait Problem     (Consider location/radiation/quality/duration/timing/severity/associated sxs/prior Treatment) HPI   81 year old male brought in by daughter for evaluation of increasing confusion and difficulty with ambulation. History is primarily obtained from daughter and review of prior records. Patient was evaluated in the emergency room on 5/5 for similar complaints. He continues to have difficulty with ambulation and seems to be favoring his right side when ambulating. No further falls since last visit. No urinary complaints. No fever. No v/d.   Past Medical History  Diagnosis Date  . Stroke   . Diabetes mellitus without complication   . CAD (coronary artery disease)   . Hypertension   . High cholesterol   . TIA (transient ischemic attack)    Past Surgical History  Procedure Laterality Date  . Carotid endarterectomy    . Coronary artery bypass graft  05/2012  . Lumbar disc surgery  1960  . Ankle surgery  1996  . Wrist surgery  1984   Family History  Problem Relation Age of Onset  . Cancer Sister    History  Substance Use Topics  . Smoking status: Former Smoker -- 1.00 packs/day    Types: Cigarettes  . Smokeless tobacco: Never Used  . Alcohol Use: No    Review of Systems  Level 5 caveat because of confusion and dysarthria makes it very difficult to understand patient.   Allergies  Ace inhibitors; Demerol; Morphine and related; Penicillins; and Tetanus toxoids  Home Medications   Prior to Admission medications   Medication Sig Start Date End Date Taking? Authorizing Provider  acetaminophen (TYLENOL) 325 MG tablet Take 650 mg by mouth Every 4 hours as needed    Historical Provider, MD  alum & mag hydroxide-simeth (MAALOX/MYLANTA) 200-200-20 MG/5ML suspension Take 30 mLs  by mouth every 6 (six) hours as needed for indigestion or heartburn.    Historical Provider, MD  aspirin 81 MG tablet Take 81 mg by mouth daily.    Historical Provider, MD  aspirin EC 325 MG EC tablet Take 1 tablet (325 mg total) by mouth daily. Patient not taking: Reported on 04/06/2015 09/14/12   Renae Fickle, MD  atorvastatin (LIPITOR) 40 MG tablet Take 40 mg by mouth daily.    Historical Provider, MD  B-D ULTRAFINE III SHORT PEN 31G X 8 MM MISC  11/09/14   Historical Provider, MD  beta carotene w/minerals (OCUVITE) tablet Take 1 tablet by mouth daily.    Historical Provider, MD  CALCIUM-VITAMIN D PO Take 1 tablet by mouth daily.    Historical Provider, MD  clonazePAM (KLONOPIN) 0.5 MG tablet Take 0.25 mg by mouth at bedtime.  11/24/14   Historical Provider, MD  clotrimazole-betamethasone (LOTRISONE) cream Apply topically Twice daily 08/10/14   Historical Provider, MD  docusate sodium (COLACE) 100 MG capsule Take 100 mg by mouth 2 (two) times daily.    Historical Provider, MD  ferrous sulfate 325 (65 FE) MG tablet Take 325 mg by mouth daily with breakfast.    Historical Provider, MD  glucose blood (BAYER CONTOUR NEXT TEST) test strip TEST 3 TIMES A DAY 07/04/14   Historical Provider, MD  hydrocortisone cream 1 % by Apply Topically route Twice per day as needed    Historical Provider, MD  ibuprofen (ADVIL,MOTRIN) 200 MG  tablet Take 400 mg by mouth every 6 (six) hours as needed for moderate pain.    Historical Provider, MD  Insulin Glargine (LANTUS SOLOSTAR) 100 UNIT/ML Solostar Pen 5 Units by Subcutaneous route Every night 12/23/13   Historical Provider, MD  irbesartan (AVAPRO) 75 MG tablet Take 75 mg by mouth at bedtime.    Historical Provider, MD  Lancets (STERILANCE TL) MISC USE TWICE DAILY 07/16/14   Historical Provider, MD  loratadine (CLARITIN) 10 MG tablet Take 10 mg by mouth Once a day    Historical Provider, MD  metoprolol succinate (TOPROL-XL) 50 MG 24 hr tablet Take 50 mg by mouth daily.  Take with or immediately following a meal.    Historical Provider, MD  Multiple Vitamin (MULTIVITAMIN WITH MINERALS) TABS Take 1 tablet by mouth daily.    Historical Provider, MD  nitroGLYCERIN (NITROSTAT) 0.4 MG SL tablet Place 0.4 mg under the tongue every 5 (five) minutes as needed. Chest pain    Historical Provider, MD  traMADol (ULTRAM) 50 MG tablet Take 1 tablet (50 mg total) by mouth every 6 (six) hours as needed. 04/06/15   Marisa Severinlga Otter, MD   BP 137/61 mmHg  Pulse 67  Temp(Src) 98 F (36.7 C) (Oral)  Resp 17  SpO2 97% Physical Exam  Constitutional: He appears well-developed and well-nourished. No distress.  HENT:  Head: Normocephalic and atraumatic.  Eyes: Conjunctivae are normal. Right eye exhibits no discharge. Left eye exhibits no discharge.  Neck: Neck supple.  Cardiovascular: Normal rate, regular rhythm and normal heart sounds.  Exam reveals no gallop and no friction rub.   No murmur heard. Pulmonary/Chest: Effort normal and breath sounds normal. No respiratory distress. He exhibits tenderness.  Tenderness to palpation right chest wall near the midaxillary line and slightly anterior. No crepitus. Overlying skin changes.  Abdominal: Soft. He exhibits no distension. There is no tenderness.  Musculoskeletal: He exhibits no edema or tenderness.  No midline spinal tenderness. No bony tenderness the extremities are apparent pain with range of motion of the large joints. Subacute appearing skin tear to left forearm.  Neurological: He is alert.  Hard of hearing. Speech is somewhat dysarthric, but daughter reports that this is baseline. Globally weak. No focal motor deficit.  Skin: Skin is warm and dry.  Psychiatric: He has a normal mood and affect. His behavior is normal. Thought content normal.  Nursing note and vitals reviewed.   ED Course  Procedures (including critical care time) Labs Review Labs Reviewed  CBC WITH DIFFERENTIAL/PLATELET - Abnormal; Notable for the following:     RBC 3.82 (*)    Hemoglobin 12.0 (*)    HCT 36.9 (*)    All other components within normal limits  BASIC METABOLIC PANEL  URINALYSIS, ROUTINE W REFLEX MICROSCOPIC    Imaging Review Dg Ribs Unilateral W/chest Right  04/12/2015   CLINICAL DATA:  Patient found on floor of home. Generalize right-sided pain. Dementia. Uncooperative and combative  EXAM: RIGHT RIBS AND CHEST - 3+ VIEW  COMPARISON:  04/06/2015  FINDINGS: There is a normal heart size. Previous median sternotomy and CABG procedure. No pleural effusion or edema noted. Scar identified in the right base. Chronic fracture deformity involving the proximal left humerus is again noted with over ride of the fracture fragments. 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 There are acute fractures involving the right anterior 7th, 8th, and 9th ribs. Marked scoliosis deformity involves the thoracic and lumbar spine.  IMPRESSION: 1. Acute right anterior rib fractures. 2.  Chronic fracture deformity involves the proximal left humerus.   Electronically Signed   By: Signa Kellaylor  Stroud M.D.   On: 04/12/2015 18:39   Ct Head Wo Contrast  04/12/2015   CLINICAL DATA:  Altered mental status.  Gait problem.  EXAM: CT HEAD WITHOUT CONTRAST  TECHNIQUE: Contiguous axial images were obtained from the base of the skull through the vertex without intravenous contrast.  COMPARISON:  09/12/2012  FINDINGS: Encephalomalacia within the left occipital lobe is again noted compatible with previous infarct. There is diffuse low attenuation throughout the subcortical and periventricular white matter compatible with chronic microvascular disease. Old left alignment lacunar infarct is again noted and appears unchanged. Chronic bilateral lacunar infarcts within the basal ganglia are noted. No evidence for acute cortical stroke, intracranial hemorrhage or mass. Prominence of the sulci and ventricles noted consistent with brain atrophy. The mastoid air cells and the  paranasal sinuses are clear. The calvarium is intact.  IMPRESSION: 1. Small vessel ischemic disease and brain atrophy. 2. Left occipital lobe encephalomalacia compatible with old infarct. 3. No acute intracranial abnormalities.   Electronically Signed   By: Signa Kellaylor  Stroud M.D.   On: 04/12/2015 18:54   Dg Hip Unilat With Pelvis 2-3 Views Right  04/12/2015   CLINICAL DATA:  Right-sided hip pain.  EXAM: RIGHT HIP (WITH PELVIS) 2-3 VIEWS  COMPARISON:  None.  FINDINGS: Severe degenerative joint disease is seen involving both hip joints. No definite fracture or dislocation is noted. Vascular calcifications are seen.  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 No acute abnormality seen in the right hip.   Electronically Signed   By: Lupita RaiderJames  Green Jr, M.D.   On: 04/12/2015 18:33     EKG Interpretation None      MDM   Final diagnoses:  Pain  Closed fracture of multiple ribs, right, initial encounter    81 year old male with some increased confusion and change in gait. Right-sided chest wall tenderness. Imaging significant for acute appearing right-sided rib fractures. Most likely occurred with this recent fall. He is dysarthric, but daughter reports that this is baseline. Disoriented to time. Neuro exam is otherwise nonfocal. CT of the head does not show any acute abnormality. No bony tenderness of the hips or apparent pain with range of motion of either hip. Plain films of the hip showing degenerative changes but no acute abnormality. Suspect he is altering his gait because chest likely worse with movement and trying to guard this area. Prescribed tramadol on recent ED visit. Daughter reports only had one dose but seemed to tolerate it fine. Concerns about being able to adequately care for him at home at this time. Already has home aid. Will discuss with case management for possible additional home assistance or placement.     Raeford RazorStephen Laurent Cargile, MD 04/19/15 [phone]

## 2015-04-12 NOTE — ED Notes (Signed)
Pt removed IV. No active bleeding at site.

## 2015-04-12 NOTE — ED Notes (Signed)
Pt is agitated but family is refusing the Haldol, states that it makes him very delirious.

## 2015-04-12 NOTE — Clinical Social Work Note (Signed)
Clinical Social Work Assessment  Patient Details  Name: Douglas May MRN: [id number] Date of Birth: 07/11/1924  Date of referral:  04/12/15               Reason for consult:  Facility Placement                Permission sought to share information with:    Permission granted to share information::     Name::        Agency::     Relationship::     Contact Information:     Housing/Transportation Living arrangements for the past 2 months:  Single Family Home Source of Information:  Adult Children Patient Interpreter Needed:  None Criminal Activity/Legal Involvement Pertinent to Current Situation/Hospitalization:  No - Comment as needed Significant Relationships:  Adult Children Lives with:  Adult Children Do you feel safe going back to the place where you live?  No Need for family participation in patient care:  Yes (Comment)  Care giving concerns:  Pt lives with his daughter and SIL.  Although pt has privately hired sitters at times, family is unable to manage his care needs due to his current physical limitations/dementia.   Social Worker assessment / plan:CSW met with pt's only child, Patty, for an lengthy discussion in the ED.  Patty has recently re-located her father her from [place] and has been caring for him with help from her spouse and privately hired caregivers.  Patty has been looking into ALF placement for pt, although she knows that pt will not like leaving her/her home, but his care needs have become too much for her.  Now that pt has some increased physical limitations dur to rib fxs, she is willing to consider SNF placement, with an ultimate transition to ALF, as appropriate.  Placement process discussed.  NH list given to Patty.  Unit CSW to pursue placement. Employment status:    Insurance information:  Managed Medicare PT Recommendations:  Not assessed at this time Information / Referral to community resources:     Patient/Family's Response to care:Pt with dementia  and daughter did not want to discuss placement in front of pt.  Daughter feels very guilty about having to place pt in a facility.  She states that they are very close and he will have a hard time not being with her all the time.  She realizes that she cannot continue caring for him, even with her best efforts.  She is agreeable to pursue placement for pt, albeit reluctantly.  Daughter very tearful, and having a hard time making decisions.  She understands that pt's dementia is progressive, and that he probably will require placement from this point on, however, she feels bad about doing something that pt will not like or understand.  Emotional support/counseling provided.    Emotional Assessment Appearance:  Appears older than stated age Attitude/Demeanor/Rapport:  Unable to Assess Affect (typically observed):  Unable to Assess Orientation:  Fluctuating Orientation (Suspected and/or reported Sundowners) Alcohol / Substance use:  Not Applicable Psych involvement (Current and /or in the community):  No (Comment)  Discharge Needs  Concerns to be addressed:  Other (Comment Required (family unable to manage pt at home in his current stste) Readmission within the last 30 days:    Current discharge risk:  None Barriers to Discharge:  No Barriers Identified   ,  M, LCSW 04/12/2015, 10:37 PM  

## 2015-04-13 DIAGNOSIS — W19XXXA Unspecified fall, initial encounter: Secondary | ICD-10-CM | POA: Diagnosis present

## 2015-04-13 DIAGNOSIS — F03918 Unspecified dementia, unspecified severity, with other behavioral disturbance: Secondary | ICD-10-CM | POA: Diagnosis present

## 2015-04-13 DIAGNOSIS — F0391 Unspecified dementia with behavioral disturbance: Secondary | ICD-10-CM | POA: Diagnosis present

## 2015-04-13 DIAGNOSIS — N179 Acute kidney failure, unspecified: Principal | ICD-10-CM

## 2015-04-13 LAB — CBC
HCT: 32.5 % — ABNORMAL LOW (ref 39.0–52.0)
HEMOGLOBIN: 10.6 g/dL — AB (ref 13.0–17.0)
MCH: 31.2 pg (ref 26.0–34.0)
MCHC: 32.6 g/dL (ref 30.0–36.0)
MCV: 95.6 fL (ref 78.0–100.0)
Platelets: 244 10*3/uL (ref 150–400)
RBC: 3.4 MIL/uL — AB (ref 4.22–5.81)
RDW: 13.1 % (ref 11.5–15.5)
WBC: 6.6 10*3/uL (ref 4.0–10.5)

## 2015-04-13 LAB — COMPREHENSIVE METABOLIC PANEL
ALBUMIN: 3 g/dL — AB (ref 3.5–5.0)
ALK PHOS: 78 U/L (ref 38–126)
ALT: 14 U/L — ABNORMAL LOW (ref 17–63)
ANION GAP: 9 (ref 5–15)
AST: 23 U/L (ref 15–41)
BUN: 21 mg/dL — AB (ref 6–20)
CALCIUM: 8.5 mg/dL — AB (ref 8.9–10.3)
CO2: 23 mmol/L (ref 22–32)
Chloride: 108 mmol/L (ref 101–111)
Creatinine, Ser: 1.34 mg/dL — ABNORMAL HIGH (ref 0.61–1.24)
GFR calc non Af Amer: 46 mL/min — ABNORMAL LOW (ref >60)
GFR, EST AFRICAN AMERICAN: 53 mL/min — AB (ref >60)
GLUCOSE: 120 mg/dL — AB (ref 65–99)
POTASSIUM: 3.8 mmol/L (ref 3.5–5.1)
SODIUM: 140 mmol/L (ref 135–145)
Total Bilirubin: 0.7 mg/dL (ref 0.3–1.2)
Total Protein: 5.4 g/dL — ABNORMAL LOW (ref 6.5–8.1)

## 2015-04-13 LAB — GLUCOSE, CAPILLARY
GLUCOSE-CAPILLARY: 105 mg/dL — AB (ref 65–99)
GLUCOSE-CAPILLARY: 180 mg/dL — AB (ref 65–99)
Glucose-Capillary: 125 mg/dL — ABNORMAL HIGH (ref 65–99)
Glucose-Capillary: 74 mg/dL (ref 65–99)

## 2015-04-13 MED ORDER — CLONAZEPAM 0.5 MG PO TABS
0.2500 mg | ORAL_TABLET | Freq: Three times a day (TID) | ORAL | Status: DC | PRN
  Administered 2015-04-14 – 2015-04-17 (×2): 0.25 mg via ORAL
  Filled 2015-04-13 (×2): qty 1

## 2015-04-13 MED ORDER — HALOPERIDOL LACTATE 5 MG/ML IJ SOLN
1.0000 mg | Freq: Once | INTRAMUSCULAR | Status: AC
  Administered 2015-04-13: 1 mg via INTRAMUSCULAR
  Filled 2015-04-13: qty 1

## 2015-04-13 MED ORDER — QUETIAPINE FUMARATE 50 MG PO TABS
25.0000 mg | ORAL_TABLET | Freq: Two times a day (BID) | ORAL | Status: DC
  Administered 2015-04-13 – 2015-04-14 (×3): 25 mg via ORAL
  Filled 2015-04-13 (×3): qty 1

## 2015-04-13 NOTE — Progress Notes (Signed)
Patient Demographics  Douglas GanongCharles May, is a 81 y.o. male, DOB - 06/26/1924, XBJ:[id number]  Admit date - 04/12/2015   Admitting Physician Rolly SalterPranav M Patel, MD  Outpatient Primary MD for the patient is Lupe Carneyean Mitchell, MD  LOS - 1   Chief Complaint  Patient presents with  . Altered Mental Status  . Gait Problem       Admission HPI/Brief narrative: Douglas May is 81 year old male, with past medical history of CVA with residual weakness, ABDs mellitus, coronary artery disease, dyslipidemia, dementia, patient was brought by his daughter for progressive decline in functional capacity over the last week, daughter reports patient has significant worsening of mentation since his CVA 2013, but had an another setback in December 2015, where he has been on continuous decline since, she reports more frequent episodes of agitation, confusion, in ED workup was significant for clinical dehydration, mild renal failure, patient reports multiple falls in the past, finding of new right rib fracture.   Subjective:   Douglas May today confused, does not answer questions appropriately, cannot provide any complaints .  Assessment & Plan    Principal Problem:   AKI (acute kidney injury) Active Problems:   HTN (hypertension)   Diabetes mellitus type 2, controlled   Hx of CABG   [place]  05/2012 in [place]   Cerebral microvascular disease   Mitral valve regurgitation, moderate   Rib fractures   Fall   Dementia with behavioral disturbance   acute renal failure - This is secondary to volume depletion from poor oral intake and dehydration - Continue with IV fluids - Continue to hold NeoProfen and ARB - Recheck BMP in a.m.[name].  Altered mental status/dementia - Appears to be delirium superimposed on worsening baseline dementia. - Daughter reports a gradual worsening of mentation over few month, with more  frequent episodes of agitation. - We'll continue with Klonopin as needed, will start on low-dose Seroquel 25 mg twice a day.  Fall - Patient appears to be having multiple falls in the past, currently having right rib fracture from fall. [facility]- We'll consult PT/OT.  Hypertension - Acceptable, continue with metoprolol  Diabetes mellitus - Continue to hold oral hypoglycemic agents, continue with insulin sliding scale  History of CVA - Continue with aspirin and statin   Code Status: DO NOT RESUSCITATE   Family Communication: Spoke with daughter  Disposition Plan: pending PT evaluation   Procedures  None   Consults   None    Medications  Scheduled Meds: . aspirin EC  81 mg Oral Daily  . atorvastatin  40 mg Oral Daily  . docusate sodium  100 mg Oral BID  . ferrous sulfate  325 mg Oral Q breakfast  . haloperidol lactate  2 mg Intramuscular Once  . heparin  5,000 Units Subcutaneous 3 times per day  . insulin aspart  0-5 Units Subcutaneous QHS  . insulin aspart  0-9 Units Subcutaneous TID WC  . loratadine  10 mg Oral Daily  . metoprolol succinate  50 mg Oral Daily  . QUEtiapine  25 mg Oral BID   Continuous Infusions: . sodium chloride 50 mL/hr at 04/13/15 1234   PRN Meds:.acetaminophen **OR** acetaminophen, clonazePAM, haloperidol lactate, ondansetron **OR** ondansetron (ZOFRAN) IV, traMADol-acetaminophen  DVT  Prophylaxis   Heparin   Lab Results  Component Value Date   PLT 244 04/13/2015    Antibiotics    Anti-infectives    None          Objective:   Filed Vitals:   04/12/15 2145 04/12/15 2200 04/12/15 2215 04/13/15 0527  BP: 137/73 152/72 152/58 111/82  Pulse: 77 69  93  Temp:    98.4 F (36.9 C)  TempSrc:    Oral  Resp:    22  Height:     (1.676 m)  Weight:    67.178 kg (148 lb 1.6 oz)  SpO2: 98% 98%  95%    Wt Readings from Last 3 Encounters:  04/13/15 67.178 kg (148 lb 1.6 oz)  11/22/14 67.132 kg (148 lb)  09/14/12 66.724 kg (147 lb 1.6  oz)     Intake/Output Summary (Last 24 hours) at 04/13/15 1536 Last data filed at 04/13/15 1336  Gross per 24 hour  Intake      0 ml  Output    300 ml  Net   -300 ml     Physical Exam  Awake Alert,  Confused PERRAL Supple Neck,No JVD, No cervical lymphadenopathy appriciated.  Symmetrical Chest wall movement, CTAB RRR,No Gallops,Rubs or new Murmurs, No Parasternal Heave +ve B.Sounds, Abd Soft, No tenderness, No organomegaly appriciated, No rebound - guarding or rigidity. No Cyanosis, Clubbing or edema, No new Rash or bruise  , patient has deformed left shoulder from previous fracture .   Data Review   Micro Results No results found for this or any previous visit (from the past 240 hour(s)).  Radiology Reports Dg Chest 2 View  04/06/2015   CLINICAL DATA:  Right lateral chest wall pain.  Cough for 2 days.  EXAM: CHEST  2 VIEW  COMPARISON:  12/22/2014  FINDINGS: There is a hiatal hernia. There is moderate unchanged cardiomegaly and aortic tortuosity. The lungs are clear. The pulmonary vasculature is normal. There are no effusions.  Incidentally noted chronic override a left humeral fracture.  IMPRESSION: Cardiomegaly.  Hiatal hernia.  No acute cardiopulmonary findings.   Electronically Signed   By: Ellery Plunk M.D.   On: 04/06/2015 05:12   Dg Ribs Unilateral W/chest Right  04/12/2015   CLINICAL DATA:  Patient found on floor of home. Generalize right-sided pain. Dementia. Uncooperative and combative  EXAM: RIGHT RIBS AND CHEST - 3+ VIEW  COMPARISON:  04/06/2015  FINDINGS: There is a normal heart size. Previous median sternotomy and CABG procedure. No pleural effusion or edema noted. Scar identified in the right base. Chronic fracture deformity involving the proximal left humerus is again noted with over ride of the fracture fragments. 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 There are acute fractures involving the right anterior 7th, 8th, and 9th ribs. Marked  scoliosis deformity involves the thoracic and lumbar spine.  IMPRESSION: 1. Acute right anterior rib fractures. 2. Chronic fracture deformity involves the proximal left humerus.   Electronically Signed   By: Signa Kell M.D.   On: 04/12/2015 18:39   Ct Head Wo Contrast  04/12/2015   CLINICAL DATA:  Altered mental status.  Gait problem.  EXAM: CT HEAD WITHOUT CONTRAST  TECHNIQUE: Contiguous axial images were obtained from the base of the skull through the vertex without intravenous contrast.  COMPARISON:  09/12/2012  FINDINGS: Encephalomalacia within the left occipital lobe is again noted compatible with previous infarct. There is diffuse low attenuation throughout the subcortical and periventricular white matter  compatible with chronic microvascular disease. Old left alignment lacunar infarct is again noted and appears unchanged. Chronic bilateral lacunar infarcts within the basal ganglia are noted. No evidence for acute cortical stroke, intracranial hemorrhage or mass. Prominence of the sulci and ventricles noted consistent with brain atrophy. The mastoid air cells and the paranasal sinuses are clear. The calvarium is intact.  IMPRESSION: 1. Small vessel ischemic disease and brain atrophy. 2. Left occipital lobe encephalomalacia compatible with old infarct. 3. No acute intracranial abnormalities.   Electronically Signed   By: Signa Kellaylor  Stroud M.D.   On: 04/12/2015 18:54   Dg Hip Unilat With Pelvis 2-3 Views Right  04/12/2015   CLINICAL DATA:  Right-sided hip pain.  EXAM: RIGHT HIP (WITH PELVIS) 2-3 VIEWS  COMPARISON:  None.  FINDINGS: Severe degenerative joint disease is seen involving both hip joints. No definite fracture or dislocation is noted. Vascular calcifications are seen.  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 No acute abnormality seen in the right hip.   Electronically Signed   By: Lupita RaiderJames  Green Jr, M.D.   On: 04/12/2015 18:33     CBC  Recent Labs Lab 04/12/15 1717  04/13/15 0337  WBC 6.6 6.6  HGB 12.0* 10.6*  HCT 36.9* 32.5*  PLT 236 244  MCV 96.6 95.6  MCH 31.4 31.2  MCHC 32.5 32.6  RDW 13.3 13.1  LYMPHSABS 1.6  --   MONOABS 0.5  --   EOSABS 0.2  --   BASOSABS 0.0  --     Chemistries   Recent Labs Lab 04/12/15 1717 04/13/15 0337  NA 138 140  K 5.2* 3.8  CL 106 108  CO2 24 23  GLUCOSE 206* 120*  BUN 25* 21*  CREATININE 1.41* 1.34*  CALCIUM 8.8* 8.5*  AST  --  23  ALT  --  14*  ALKPHOS  --  78  BILITOT  --  0.7   ------------------------------------------------------------------------------------------------------------------ estimated creatinine clearance is 34.4 mL/min (by C-G formula based on Cr of 1.34). ------------------------------------------------------------------------------------------------------------------ No results for input(s): HGBA1C in the last 72 hours. ------------------------------------------------------------------------------------------------------------------ No results for input(s): CHOL, HDL, LDLCALC, TRIG, CHOLHDL, LDLDIRECT in the last 72 hours. ------------------------------------------------------------------------------------------------------------------ No results for input(s): TSH, T4TOTAL, T3FREE, THYROIDAB in the last 72 hours.  Invalid input(s): FREET3 ------------------------------------------------------------------------------------------------------------------ No results for input(s): VITAMINB12, FOLATE, FERRITIN, TIBC, IRON, RETICCTPCT in the last 72 hours.  Coagulation profile No results for input(s): INR, PROTIME in the last 168 hours.  No results for input(s): DDIMER in the last 72 hours.  Cardiac Enzymes No results for input(s): CKMB, TROPONINI, MYOGLOBIN in the last 168 hours.  Invalid input(s): CK ------------------------------------------------------------------------------------------------------------------ Invalid input(s): POCBNP     Time Spent in minutes    30 minutes   Laresa Oshiro M.D on 04/13/2015 at 3:36 PM  Between 7am to 7pm - Pager - [phone]  After 7pm go to [email or web address] - password [email or web address]  [facility]   Office  [phone]

## 2015-04-13 NOTE — Progress Notes (Signed)
Patient admitted to [facility] in a very agitated and confused state;multiple attempts to get out of bed without of assistance. Patient fell at home and is a high fall risk.  Order obtained for restraints, but patient fell asleep and restraints not applied.  Bed alarm on.  Patient's daughter and son-in-law at bedside.  Safety sitter requested.  Unable to assess patient or administer subQ heparin due to patient's agitation.

## 2015-04-13 NOTE — Evaluation (Signed)
Physical Therapy Evaluation Patient Details Name: Douglas ShutterCharles F May MRN: [id number] DOB: [birth year] Today's Date: 04/13/2015   History of Present Illness  Mr. Douglas DuralCharles May is 81 year old male, with past medical history of CVA with residual weakness, ABDs mellitus, coronary artery disease, dyslipidemia, dementia, patient was brought by his daughter for progressive decline in functional capacity. Found to have acute kidney injury and right rib fractures.  Clinical Impression  Pt admitted with the above diagnosis. Limited assessment due to pt's restlessness and refusal to sit on edge of bed. He was able to follow simple, single step commands inconsistently, and rolled in bed with min assist/mod cues. From previous notes pt was more independent with daughter prior to his current functional status. Due to his altered mental state from baseline, pt would benefit from SNF to improve his functional abilities, reduce burden of care, and reduce fall risk.  Pt currently with functional limitations due to the deficits listed below (see PT Problem List).      Follow Up Recommendations SNF;Supervision/Assistance - 24 hour    Equipment Recommendations  None recommended by PT    Recommendations for Other Services       Precautions / Restrictions Precautions Precautions: Fall Restrictions Weight Bearing Restrictions: No      Mobility  Bed Mobility Overal bed mobility: Needs Assistance Bed Mobility: Rolling Rolling: Min assist         General bed mobility comments: min assist for initiation to roll onto Rt and left side. Refused to sit EOB. Moderate cues for technique, appears to have an easier time rolling onto right side when facilitated.  Transfers                    Ambulation/Gait                Stairs            Wheelchair Mobility    Modified Rankin (Stroke Patients Only)       Balance Overall balance assessment: History of Falls                                           Pertinent Vitals/Pain Pain Assessment: No/denies pain ("No" when asked if pt is hurting anywhere) Pain Intervention(s): Monitored during session    Home Living Family/patient expects to be discharged to:: Skilled nursing facility                 Additional Comments: No family available, patient unable to answer questions due to poor cognition.    Prior Function           Comments: No family available, patient unable to answer questions due to poor cognition. From previous notes, pt was mobilizing at home prior to admission although has had an increase in frequency of falls lately.     Hand Dominance        Extremity/Trunk Assessment   Upper Extremity Assessment: Defer to OT evaluation           Lower Extremity Assessment: Difficult to assess due to impaired cognition         Communication   Communication: Expressive difficulties  Cognition Arousal/Alertness: Awake/alert Behavior During Therapy: Restless Overall Cognitive Status: No family/caregiver present to determine baseline cognitive functioning                      General  Comments General comments (skin integrity, edema, etc.): Patient following single step commands inconsistently. Able to lift 3/4 extremities off of bed with tactile cues. Refused to lift LUE from bed.    Exercises        Assessment/Plan    PT Assessment Patient needs continued PT services  PT Diagnosis Difficulty walking;Altered mental status   PT Problem List Decreased strength;Decreased range of motion;Decreased activity tolerance;Decreased balance;Decreased mobility;Decreased cognition;Decreased knowledge of use of DME;Decreased safety awareness;Decreased knowledge of precautions  PT Treatment Interventions DME instruction;Gait training;Functional mobility training;Therapeutic activities;Therapeutic exercise;Balance training;Cognitive remediation;Patient/family education   PT  Goals (Current goals can be found in the Care Plan section) Acute Rehab PT Goals Patient Stated Goal: none stated PT Goal Formulation: Patient unable to participate in goal setting Time For Goal Achievement: 04/27/15 Potential to Achieve Goals: Fair    Frequency Min 2X/week   Barriers to discharge        Co-evaluation               End of Session   Activity Tolerance: Other (comment) (limited by poor cognition) Patient left: in bed;with call bell/phone within reach;with nursing/sitter in room Nurse Communication: Mobility status         Time: [id number] PT Time Calculation (min) (ACUTE ONLY): 10 min   Charges:   PT Evaluation $Initial PT Evaluation Tier I: 1 Procedure     PT G CodesBerton Mount:        Pranathi Winfree S 04/13/2015, 5:12 PM Sunday SpillersLogan Secor [place], [place] [phone]

## 2015-04-13 NOTE — Progress Notes (Signed)
Pt very agitated at this time and pulled out IV.  Pt also refusing PO meds at this time.  MD notified and new order received.  Will give medication and continue to monitor.  Olivine Hiers,Douglas May Hoa Briggs [place]say

## 2015-04-13 NOTE — H&P (Addendum)
[facility] History and Physical  Patient: Douglas May  MRN: [id number]  DOB: [birth year]  DOS: the patient was seen and examined on 04/12/2015 PCP: Lupe Carney, MD  Referring physician: Dr. Juleen China Chief Complaint: Fall  HPI: Douglas May is a 81 y.o. male with Past medical history of CVA with residual weakness, diabetes mellitus, coronary artery disease, dyslipidemia, dementia. The patient is brought in by family. The patient has been having progressively declining status over last 3-4 days. The patient was found to have a fall and was seen in the ER one week ago. His workup was unremarkable and he was sent home. Patient continues to have generalized weakness since then. He also has worsening confusion from his baseline. He has poor oral intake. He has a chronic cough. He does not have any choking episode or fever or nausea or vomiting or diarrhea or constipation. He does not complain of any burning urination. No recent change in his medications reported. He continues to have complaints of pain on the chest. He has been having difficulty ambulating since last 2 days and has been leaning towards his right side. He also has been having difficulty feeding himself with his right hand. He is a right-handed person.  The patient is coming from home And at his baseline independent for most of his ADL.  Review of Systems: as mentioned in the history of present illness.  A comprehensive review of the other systems is negative.  Past Medical History  Diagnosis Date  . Stroke   . Diabetes mellitus without complication   . CAD (coronary artery disease)   . Hypertension   . High cholesterol   . TIA (transient ischemic attack)    Past Surgical History  Procedure Laterality Date  . Carotid endarterectomy    . Coronary artery bypass graft  05/2012  . Lumbar disc surgery  1960  . Ankle surgery  1996  . Wrist surgery  1984   Social History:  reports that he has quit  smoking. His smoking use included Cigarettes. He smoked 1.00 pack per day. He has never used smokeless tobacco. He reports that he does not drink alcohol or use illicit drugs.  Allergies  Allergen Reactions  . Ace Inhibitors Other (See Comments)  . Demerol [Meperidine] Other (See Comments)  . Morphine And Related Itching  . Penicillins Other (See Comments)    unknown  . Tetanus Toxoids Other (See Comments)    Family History  Problem Relation Age of Onset  . Cancer Sister     Prior to Admission medications   Medication Sig Start Date End Date Taking? Authorizing Provider  acetaminophen (TYLENOL) 325 MG tablet Take 650 mg by mouth Every 4 hours as needed   Yes Historical Provider, MD  alum & mag hydroxide-simeth (MAALOX/MYLANTA) 200-200-20 MG/5ML suspension Take 30 mLs by mouth every 6 (six) hours as needed for indigestion or heartburn.   Yes Historical Provider, MD  aspirin 81 MG tablet Take 81 mg by mouth daily.   Yes Historical Provider, MD  atorvastatin (LIPITOR) 40 MG tablet Take 40 mg by mouth daily.   Yes Historical Provider, MD  beta carotene w/minerals (OCUVITE) tablet Take 1 tablet by mouth 2 (two) times daily.    Yes Historical Provider, MD  CALCIUM-VITAMIN D PO Take 1 tablet by mouth daily.   Yes Historical Provider, MD  cetirizine (ZYRTEC) 10 MG tablet Take 10 mg by mouth daily.   Yes Historical Provider, MD  clonazePAM (KLONOPIN) 0.5 MG tablet  Take 0.25-0.5 mg by mouth at bedtime.  11/24/14  Yes Historical Provider, MD  clotrimazole-betamethasone (LOTRISONE) cream Apply topically Twice daily 08/10/14  Yes Historical Provider, MD  docusate sodium (COLACE) 100 MG capsule Take 50 mg by mouth 2 (two) times daily.    Yes Historical Provider, MD  ferrous sulfate 325 (65 FE) MG tablet Take 325 mg by mouth daily with breakfast.   Yes Historical Provider, MD  hydrocortisone cream 1 % by Apply Topically route Twice per day as needed   Yes Historical Provider, MD  ibuprofen  (ADVIL,MOTRIN) 200 MG tablet Take 400 mg by mouth every 6 (six) hours as needed for moderate pain.   Yes Historical Provider, MD  Insulin Glargine (LANTUS SOLOSTAR) 100 UNIT/ML Solostar Pen 5 Units by Subcutaneous route Every night 12/23/13  Yes Historical Provider, MD  irbesartan (AVAPRO) 75 MG tablet Take 75 mg by mouth at bedtime.   Yes Historical Provider, MD  metoprolol succinate (TOPROL-XL) 50 MG 24 hr tablet Take 50 mg by mouth daily. Take with or immediately following a meal.   Yes Historical Provider, MD  Multiple Vitamin (MULTIVITAMIN WITH MINERALS) TABS Take 1 tablet by mouth daily.   Yes Historical Provider, MD  nitroGLYCERIN (NITROSTAT) 0.4 MG SL tablet Place 0.4 mg under the tongue every 5 (five) minutes as needed. Chest pain   Yes Historical Provider, MD  traMADol (ULTRAM) 50 MG tablet Take 1 tablet (50 mg total) by mouth every 6 (six) hours as needed. Patient taking differently: Take 50 mg by mouth every 6 (six) hours as needed for moderate pain.  04/06/15  Yes Marisa Severinlga Otter, MD  aspirin EC 325 MG EC tablet Take 1 tablet (325 mg total) by mouth daily. Patient not taking: Reported on 04/06/2015 09/14/12   Renae FickleMackenzie Short, MD    Physical Exam: Filed Vitals:   04/12/15 2130 04/12/15 2145 04/12/15 2200 04/12/15 2215  BP: 149/62 137/73 152/72 152/58  Pulse: 66 77 69   Temp:      TempSrc:      Resp:      SpO2: 99% 98% 98%     General: Alert, Awake and Oriented to Person. Appear in mild distress Eyes: PERRL ENT: Oral Mucosa clear moist. Neck: no JVD Cardiovascular: S1 and S2 Present, aortic systolic Murmur, Peripheral Pulses Present Respiratory: Bilateral Air entry equal and Decreased,  Clear to Auscultation, no Crackles, no wheezes Abdomen: Bowel Sound present, Soft and non tender Skin: no Rash Extremities: no Pedal edema, no calf tenderness Neurologic: Grossly no focal neuro deficit other than confusion and agitation  Labs on Admission:  CBC:  Recent Labs Lab 04/12/15 1717  04/13/15 0337  WBC 6.6 6.6  NEUTROABS 4.2  --   HGB 12.0* 10.6*  HCT 36.9* 32.5*  MCV 96.6 95.6  PLT 236 244    CMP     Component Value Date/Time   NA 140 04/13/2015 0337   K 3.8 04/13/2015 0337   CL 108 04/13/2015 0337   CO2 23 04/13/2015 0337   GLUCOSE 120* 04/13/2015 0337   BUN 21* 04/13/2015 0337   CREATININE 1.34* 04/13/2015 0337   CALCIUM 8.5* 04/13/2015 0337   PROT 5.4* 04/13/2015 0337   ALBUMIN 3.0* 04/13/2015 0337   AST 23 04/13/2015 0337   ALT 14* 04/13/2015 0337   ALKPHOS 78 04/13/2015 0337   BILITOT 0.7 04/13/2015 0337   GFRNONAA 46* 04/13/2015 0337   GFRAA 53* 04/13/2015 0337    No results for input(s): LIPASE, AMYLASE in the last 168  hours.  No results for input(s): CKTOTAL, CKMB, CKMBINDEX, TROPONINI in the last 168 hours. BNP (last 3 results) No results for input(s): BNP in the last 8760 hours.  ProBNP (last 3 results) No results for input(s): PROBNP in the last 8760 hours.   Radiological Exams on Admission: Dg Ribs Unilateral W/chest Right  04/12/2015   CLINICAL DATA:  Patient found on floor of home. Generalize right-sided pain. Dementia. Uncooperative and combative  EXAM: RIGHT RIBS AND CHEST - 3+ VIEW  COMPARISON:  04/06/2015  FINDINGS: There is a normal heart size. Previous median sternotomy and CABG procedure. No pleural effusion or edema noted. Scar identified in the right base. Chronic fracture deformity involving the proximal left humerus is again noted with over ride of the fracture fragments. 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 There are acute fractures involving the right anterior 7th, 8th, and 9th ribs. Marked scoliosis deformity involves the thoracic and lumbar spine.  IMPRESSION: 1. Acute right anterior rib fractures. 2. Chronic fracture deformity involves the proximal left humerus.   Electronically Signed   By: Signa Kellaylor  Stroud M.D.   On: 04/12/2015 18:39   Ct Head Wo Contrast  04/12/2015   CLINICAL DATA:  Altered  mental status.  Gait problem.  EXAM: CT HEAD WITHOUT CONTRAST  TECHNIQUE: Contiguous axial images were obtained from the base of the skull through the vertex without intravenous contrast.  COMPARISON:  09/12/2012  FINDINGS: Encephalomalacia within the left occipital lobe is again noted compatible with previous infarct. There is diffuse low attenuation throughout the subcortical and periventricular white matter compatible with chronic microvascular disease. Old left alignment lacunar infarct is again noted and appears unchanged. Chronic bilateral lacunar infarcts within the basal ganglia are noted. No evidence for acute cortical stroke, intracranial hemorrhage or mass. Prominence of the sulci and ventricles noted consistent with brain atrophy. The mastoid air cells and the paranasal sinuses are clear. The calvarium is intact.  IMPRESSION: 1. Small vessel ischemic disease and brain atrophy. 2. Left occipital lobe encephalomalacia compatible with old infarct. 3. No acute intracranial abnormalities.   Electronically Signed   By: Signa Kellaylor  Stroud M.D.   On: 04/12/2015 18:54   Dg Hip Unilat With Pelvis 2-3 Views Right  04/12/2015   CLINICAL DATA:  Right-sided hip pain.  EXAM: RIGHT HIP (WITH PELVIS) 2-3 VIEWS  COMPARISON:  None.  FINDINGS: Severe degenerative joint disease is seen involving both hip joints. No definite fracture or dislocation is noted. Vascular calcifications are seen.  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 No acute abnormality seen in the right hip.   Electronically Signed   By: Lupita RaiderJames  Green Jr, M.D.   On: 04/12/2015 18:33   EKG: Independently reviewed. normal sinus rhythm, nonspecific ST and T waves changes.  Assessment/Plan Principal Problem:   AKI (acute kidney injury) Active Problems:   HTN (hypertension)   Diabetes mellitus type 2, controlled   Hx of CABG   [place]   Cerebral microvascular disease   Mitral valve regurgitation, moderate   Rib  fractures   Fall   Dementia with behavioral disturbance   1. AKI (acute kidney injury) The patient is presenting with complaints of poor oral intake after a fall with worsening mental status changes and increasing confusion as well as agitation. His confusion and agitation could be secondary to pain. But he is found to have acute kidney injury with hyperkalemia. He does not have any EKG changes. At present potassium is not significantly  elevated. I will monitor him in the hospital with IV hydration. Holding ibuprofen as well as ARB.  2. Fall. Rib fracture. The patient does not have any abnormality on CAT scan on the x-ray of the hip. X-ray Rib shows multiple rib fractures. Patient is not hypoxic but is no pneumothorax. We will closely monitor him in the hospital. pain management with tramadol. Escalate as needed. Incentive spirometry.  3. Dementia. Agitation. Patient has history of dementia and appears mildly agitated at present. Will use when necessary Haldol. Next and continue with lorazepam at home doses. Family was informed that there might be a need for restraints to avoid patient harming himself in the hospital environment if he continues to have worsening agitation.  4. Essential hypertension. Next and blood pressure stable. Holding ARB.  5. Diabetes mellitus. Holding hypoglycemic agent and placing the patient on sliding scale insulin.  Advance goals of care discussion: DNR/DNI as per my discussion with patient's daughter who is the power of attorney.   DVT Prophylaxis: subcutaneous Heparin Nutrition: regular diet  Family Communication: family was present at bedside, opportunity was given to ask question and all questions were answered satisfactorily at the time of interview. Disposition: Admitted as inpatient, med-surge unit.  Author: Lynden Oxford, MD [facility] Pager: [phone]   If 7PM-7AM, please contact night-coverage [email or web address] Password  [email or web address]

## 2015-04-13 NOTE — Care Management Note (Signed)
Case Management Note  Patient Details  Name: Douglas May MRN: [id number] Date of Birth: 12-02-1924  Subjective/Objective:                    Action/Plan: Referral for LTAC , patient not LTAC appropriate, SW following for SNF.    Expected Discharge Date:                  Expected Discharge Plan:  Skilled Nursing Facility  In-House Referral:  Clinical Social Work  Discharge planning Services     Post Acute Care Choice:    Choice offered to:     DME Arranged:    DME Agency:     HH Arranged:    HH Agency:     Status of Service:  Completed, signed off  Medicare Important Message Given:    Date Medicare IM Given:    Medicare IM give by:    Date Additional Medicare IM Given:    Additional Medicare Important Message give by:     If discussed at Long Length of Stay Meetings, dates discussed:    Additional Comments:  Kingsley PlanWile, Douglas Snowden Marie, RN 04/13/2015, 9:46 AM

## 2015-04-14 LAB — GLUCOSE, CAPILLARY
GLUCOSE-CAPILLARY: 132 mg/dL — AB (ref 65–99)
GLUCOSE-CAPILLARY: 130 mg/dL — AB (ref 65–99)
Glucose-Capillary: 113 mg/dL — ABNORMAL HIGH (ref 65–99)
Glucose-Capillary: 128 mg/dL — ABNORMAL HIGH (ref 65–99)
Glucose-Capillary: 237 mg/dL — ABNORMAL HIGH (ref 65–99)

## 2015-04-14 MED ORDER — QUETIAPINE FUMARATE 50 MG PO TABS
25.0000 mg | ORAL_TABLET | Freq: Three times a day (TID) | ORAL | Status: DC
  Administered 2015-04-14 – 2015-04-17 (×9): 25 mg via ORAL
  Filled 2015-04-14 (×9): qty 1

## 2015-04-14 NOTE — Evaluation (Signed)
Occupational Therapy Evaluation Patient Details Name: FIELD STANISZEWSKI MRN: [id number] DOB: 1924-04-14 Today's Date: 04/14/2015    History of Present Illness Mr. Jameis Newsham is 81 year old male, with past medical history of CVA with residual weakness, ABDs mellitus, coronary artery disease, dyslipidemia, dementia, patient was brought by his daughter for progressive decline in functional capacity. Found to have acute kidney injury and right rib fractures, L shoulder dislocation?.   Clinical Impression   PTA pt lived at home with his daughter and was independent with functional mobility with use of RW. Pt had assistance for ADLs from his daughter and an aide. Caregiver was present during session and provided information. Pt currently requires total A for ADLs and all further OT needs will be met at next venue of care (SNF).     Follow Up Recommendations  SNF;Supervision/Assistance - 24 hour    Equipment Recommendations  Other (comment) (defer to SNF)    Recommendations for Other Services       Precautions / Restrictions Precautions Precautions: Fall Restrictions Weight Bearing Restrictions: No      Mobility Bed Mobility Overal bed mobility: Needs Assistance Bed Mobility: Rolling Rolling: Max assist         General bed mobility comments: Max assist to roll, likely due to lethargy.   Transfers                 General transfer comment: NT at this time due to lethargy and safety concerns.     Balance Overall balance assessment: History of Falls                                          ADL Overall ADL's : Needs assistance/impaired                                       General ADL Comments: Pt currently at Total A for ADLs. Pt difficult to arouse and unable to get to EOB for sitting. Pt minimally following commands and started to become agitated.      Vision Additional Comments: unable to assess          Pertinent  Vitals/Pain Pain Assessment: Faces Faces Pain Scale: Hurts even more Pain Location: grimacing and indicated pain in right arm with ROM Pain Descriptors / Indicators: Grimacing Pain Intervention(s): Limited activity within patient's tolerance;Monitored during session;Repositioned     Hand Dominance     Extremity/Trunk Assessment Upper Extremity Assessment Upper Extremity Assessment: RUE deficits/detail;LUE deficits/detail;Difficult to assess due to impaired cognition RUE Deficits / Details: Pt c/o pain in RUE when ROM. 5th digit amputated at PIP. 3rd digit in mild flexion contraction. Pt able to squeeze with R hand. PROM provided to shoulder to ~ 3/4 range.  LUE Deficits / Details: L shoulder dislocation? Humerus head is apparent under skin, anterior to glenoid socket.   Lower Extremity Assessment Lower Extremity Assessment: Defer to PT evaluation       Communication Communication Communication: Expressive difficulties   Cognition Arousal/Alertness: Lethargic;Suspect due to medications Behavior During Therapy: Flat affect Overall Cognitive Status: Difficult to assess                                Home Living Family/patient expects to be discharged to:: Skilled  nursing facility                                 Additional Comments: Per caregiver, pt lives at home with his daughter.      Prior Functioning/Environment Level of Independence: Needs assistance  Gait / Transfers Assistance Needed: pt was independent with ambulation using RW around the home.  ADL's / Homemaking Assistance Needed: Pt required assistance for ADLs from daughter or aide.  Communication / Swallowing Assistance Needed: Pt minimally conversant due to lethargy      OT Diagnosis: Generalized weakness;Cognitive deficits;Acute pain                          End of Session  Activity Tolerance: Patient limited by lethargy;Treatment limited secondary to agitation Patient left:  in bed;with call bell/phone within reach;with family/visitor present;with nursing/sitter in room   Time: 2951-8841 OT Time Calculation (min): 15 min Charges:  OT General Charges $OT Visit: 1 Procedure OT Evaluation $Initial OT Evaluation Tier I: 1 Procedure G-Codes:    Juluis Rainier 30-Apr-2015, 4:38 PM  Cyndie Chime, OTR/L Occupational Therapist [phone] (pager)

## 2015-04-14 NOTE — Clinical Social Work Placement (Addendum)
   CLINICAL SOCIAL WORK PLACEMENT  NOTE  Date:  04/14/2015  Patient Details  Name: Douglas May MRN: [id number] Date of Birth: Feb 11, 1924  Clinical Social Work is seeking post-discharge placement for this patient at the Skilled  Nursing Facility level of care (*CSW will initial, date and re-position this form in  chart as items are completed):  Yes   Patient/family provided with [facility] Clinical Social Work Department's list of facilities offering this level of care within the geographic area requested by the patient (or if unable, by the patient's family).  Yes   Patient/family informed of their freedom to choose among providers that offer the needed level of care, that participate in Medicare, Medicaid or managed care program needed by the patient, have an available bed and are willing to accept the patient.  Yes   Patient/family informed of [facility]'s ownership interest [facility] and [facility], as well as of the fact that they are under no obligation to receive care at these facilities.  PASRR submitted to EDS on 04/13/15     PASRR number received on 04/13/15     Existing PASRR number confirmed on       FL2 transmitted to all facilities in geographic area requested by pt/family on 04/13/15     FL2 transmitted to all facilities within larger geographic area on       Patient informed that his/her managed care company has contracts with or will negotiate with certain facilities, including the following:        Yes   Patient/family informed of bed offers received.  Patient chooses bed at       Physician recommends and patient chooses bed at      Patient to be transferred to   on  .  Patient to be transferred to facility by       Patient family notified on   of transfer.  Name of family member notified:        PHYSICIAN       Additional Comment:   Pt faxed out by Windell MouldingEric Anterhaus at 6:17pm on  04/13/15 _______________________________________________ Izora RibasHoloman, Rosamaria Donn M, LCSW 04/14/2015, 2:56 PM

## 2015-04-14 NOTE — Clinical Social Work Note (Signed)
CSW spoke with pt dtr to give bed offers.  Pt is interested in [facility] vs [facility].  [facility] is out of network but pt has out of network benefits.  [facility] has initiated Therapist, occupationalinsurance approval.  CSW will continue to follow.  Merlyn LotJenna Holoman, LCSWA Clinical Social Worker [phone]

## 2015-04-14 NOTE — Progress Notes (Signed)
Patient Demographics  Douglas GanongCharles May, is a 81 y.o. male, DOB - 1924/07/27, [id number]  Admit date - 04/12/2015   Admitting Physician Rolly SalterPranav M Patel, MD  Outpatient Primary MD for the patient is Lupe Carneyean Mitchell, MD  LOS - 2   Chief Complaint  Patient presents with  . Altered Mental Status  . Gait Problem       Admission HPI/Brief narrative: Mr. Douglas DuralCharles May is 81 year old male, with past medical history of CVA with residual weakness, ABDs mellitus, coronary artery disease, dyslipidemia, dementia, patient was brought by his daughter for progressive decline in functional capacity over the last week, daughter reports patient has significant worsening of mentation since his CVA 2013, but had an another setback in December 2015, where he has been on continuous decline since, she reports more frequent episodes of agitation, confusion, in ED workup was significant for clinical dehydration, mild renal failure, patient reports multiple falls in the past, finding of new right rib fracture.   Subjective:   Douglas GanongCharles May today confused, does not answer questions appropriately, cannot provide any complaints .  Assessment & Plan    Principal Problem:   AKI (acute kidney injury) Active Problems:   HTN (hypertension)   Diabetes mellitus type 2, controlled   Hx of CABG   [place]  05/2012 in [place]   Cerebral microvascular disease   Mitral valve regurgitation, moderate   Rib fractures   Fall   Dementia with behavioral disturbance   acute renal failure - This is secondary to volume depletion from poor oral intake and dehydration - Continue with IV fluids - Continue to hold ibuProfen and ARB - Recheck BMP in a.m.[name].  Altered mental status/dementia - Appears to be delirium superimposed on worsening progressive baseline dementia. - Daughter reports a gradual worsening of mentation over few month,  with more frequent episodes of agitation. - We'll continue with Klonopin as needed, will increase Seroquel to 25 mg 3 times a day.  Fall - Patient appears to be having multiple falls in the past, currently having right rib fracture from fall. - continue with PT.  Hypertension - Acceptable, continue with metoprolol  Diabetes mellitus - Continue to hold oral hypoglycemic agents, continue with insulin sliding scale  History of CVA - Continue with aspirin and statin   Code Status: DO NOT RESUSCITATE   Family Communication: Spoke with daughter 04/13/15  Disposition Plan: pending PT evaluation   Procedures  None   Consults   None    Medications  Scheduled Meds: . aspirin EC  81 mg Oral Daily  . atorvastatin  40 mg Oral Daily  . docusate sodium  100 mg Oral BID  . ferrous sulfate  325 mg Oral Q breakfast  . haloperidol lactate  2 mg Intramuscular Once  . heparin  5,000 Units Subcutaneous 3 times per day  . insulin aspart  0-5 Units Subcutaneous QHS  . insulin aspart  0-9 Units Subcutaneous TID WC  . loratadine  10 mg Oral Daily  . metoprolol succinate  50 mg Oral Daily  . QUEtiapine  25 mg Oral TID   Continuous Infusions: . sodium chloride 50 mL/hr at 04/13/15 1234   PRN Meds:.acetaminophen **OR** acetaminophen, clonazePAM, haloperidol lactate, ondansetron **OR** ondansetron (ZOFRAN) IV, traMADol-acetaminophen  DVT Prophylaxis   Heparin   Lab Results  Component Value Date   PLT 244 04/13/2015    Antibiotics    Anti-infectives    None          Objective:   Filed Vitals:   04/13/15 1820 04/13/15 2109 04/14/15 0605 04/14/15 1331  BP: 131/78 111/64 122/57 112/52  Pulse:  71 66 85  Temp:  98.1 F (36.7 C) 98.2 F (36.8 C) 99.2 F (37.3 C)  TempSrc:  Axillary Axillary Axillary  Resp:  16 21 20   Height:      Weight:      SpO2:  100% 100% 97%    Wt Readings from Last 3 Encounters:  04/13/15 67.178 kg (148 lb 1.6 oz)  11/22/14 67.132 kg (148 lb)    09/14/12 66.724 kg (147 lb 1.6 oz)     Intake/Output Summary (Last 24 hours) at 04/14/15 1639 Last data filed at 04/14/15 1524  Gross per 24 hour  Intake    300 ml  Output    200 ml  Net    100 ml     Physical Exam  Awake Alert,  Confused PERRAL Supple Neck,No JVD, No cervical lymphadenopathy appriciated.  Symmetrical Chest wall movement, CTAB RRR,No Gallops,Rubs or new Murmurs, No Parasternal Heave +ve B.Sounds, Abd Soft, No tenderness, No organomegaly appriciated, No rebound - guarding or rigidity. No Cyanosis, Clubbing or edema, No new Rash or bruise  , patient has deformed left shoulder from previous fracture .   Data Review   Micro Results No results found for this or any previous visit (from the past 240 hour(s)).  Radiology Reports Dg Chest 2 View  04/06/2015   CLINICAL DATA:  Right lateral chest wall pain.  Cough for 2 days.  EXAM: CHEST  2 VIEW  COMPARISON:  12/22/2014  FINDINGS: There is a hiatal hernia. There is moderate unchanged cardiomegaly and aortic tortuosity. The lungs are clear. The pulmonary vasculature is normal. There are no effusions.  Incidentally noted chronic override a left humeral fracture.  IMPRESSION: Cardiomegaly.  Hiatal hernia.  No acute cardiopulmonary findings.   Electronically Signed   By: Ellery Plunkaniel R Mitchell M.D.   On: 04/06/2015 05:12   Dg Ribs Unilateral W/chest Right  04/12/2015   CLINICAL DATA:  Patient found on floor of home. Generalize right-sided pain. Dementia. Uncooperative and combative  EXAM: RIGHT RIBS AND CHEST - 3+ VIEW  COMPARISON:  04/06/2015  FINDINGS: There is a normal heart size. Previous median sternotomy and CABG procedure. No pleural effusion or edema noted. Scar identified in the right base. Chronic fracture deformity involving the proximal left humerus is again noted with over ride of the fracture fragments. 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 There are acute fractures involving the right anterior  7th, 8th, and 9th ribs. Marked scoliosis deformity involves the thoracic and lumbar spine.  IMPRESSION: 1. Acute right anterior rib fractures. 2. Chronic fracture deformity involves the proximal left humerus.   Electronically Signed   By: Signa Kellaylor  Stroud M.D.   On: 04/12/2015 18:39   Ct Head Wo Contrast  04/12/2015   CLINICAL DATA:  Altered mental status.  Gait problem.  EXAM: CT HEAD WITHOUT CONTRAST  TECHNIQUE: Contiguous axial images were obtained from the base of the skull through the vertex without intravenous contrast.  COMPARISON:  09/12/2012  FINDINGS: Encephalomalacia within the left occipital lobe is again noted compatible with previous infarct. There is diffuse low attenuation throughout the subcortical and periventricular white matter compatible  with chronic microvascular disease. Old left alignment lacunar infarct is again noted and appears unchanged. Chronic bilateral lacunar infarcts within the basal ganglia are noted. No evidence for acute cortical stroke, intracranial hemorrhage or mass. Prominence of the sulci and ventricles noted consistent with brain atrophy. The mastoid air cells and the paranasal sinuses are clear. The calvarium is intact.  IMPRESSION: 1. Small vessel ischemic disease and brain atrophy. 2. Left occipital lobe encephalomalacia compatible with old infarct. 3. No acute intracranial abnormalities.   Electronically Signed   By: Signa Kell M.D.   On: 04/12/2015 18:54   Dg Hip Unilat With Pelvis 2-3 Views Right  04/12/2015   CLINICAL DATA:  Right-sided hip pain.  EXAM: RIGHT HIP (WITH PELVIS) 2-3 VIEWS  COMPARISON:  None.  FINDINGS: Severe degenerative joint disease is seen involving both hip joints. No definite fracture or dislocation is noted. Vascular calcifications are seen.  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 No acute abnormality seen in the right hip.   Electronically Signed   By: Lupita Raider, M.D.   On: 04/12/2015 18:33     CBC  Recent  Labs Lab 04/12/15 1717 04/13/15 0337  WBC 6.6 6.6  HGB 12.0* 10.6*  HCT 36.9* 32.5*  PLT 236 244  MCV 96.6 95.6  MCH 31.4 31.2  MCHC 32.5 32.6  RDW 13.3 13.1  LYMPHSABS 1.6  --   MONOABS 0.5  --   EOSABS 0.2  --   BASOSABS 0.0  --     Chemistries   Recent Labs Lab 04/12/15 1717 04/13/15 0337  NA 138 140  K 5.2* 3.8  CL 106 108  CO2 24 23  GLUCOSE 206* 120*  BUN 25* 21*  CREATININE 1.41* 1.34*  CALCIUM 8.8* 8.5*  AST  --  23  ALT  --  14*  ALKPHOS  --  78  BILITOT  --  0.7   ------------------------------------------------------------------------------------------------------------------ estimated creatinine clearance is 34.4 mL/min (by C-G formula based on Cr of 1.34). ------------------------------------------------------------------------------------------------------------------ No results for input(s): HGBA1C in the last 72 hours. ------------------------------------------------------------------------------------------------------------------ No results for input(s): CHOL, HDL, LDLCALC, TRIG, CHOLHDL, LDLDIRECT in the last 72 hours. ------------------------------------------------------------------------------------------------------------------ No results for input(s): TSH, T4TOTAL, T3FREE, THYROIDAB in the last 72 hours.  Invalid input(s): FREET3 ------------------------------------------------------------------------------------------------------------------ No results for input(s): VITAMINB12, FOLATE, FERRITIN, TIBC, IRON, RETICCTPCT in the last 72 hours.  Coagulation profile No results for input(s): INR, PROTIME in the last 168 hours.  No results for input(s): DDIMER in the last 72 hours.  Cardiac Enzymes No results for input(s): CKMB, TROPONINI, MYOGLOBIN in the last 168 hours.  Invalid input(s): CK ------------------------------------------------------------------------------------------------------------------ Invalid input(s):  POCBNP     Time Spent in minutes   25 minutes   Johnathyn Viscomi M.D on 04/14/2015 at 4:39 PM  Between 7am to 7pm - Pager - [phone]  After 7pm go to [email or web address] - password [facility]  [facility]   Office  [phone]

## 2015-04-15 LAB — BASIC METABOLIC PANEL
Anion gap: 9 (ref 5–15)
BUN: 14 mg/dL (ref 6–20)
CALCIUM: 8.1 mg/dL — AB (ref 8.9–10.3)
CHLORIDE: 108 mmol/L (ref 101–111)
CO2: 23 mmol/L (ref 22–32)
Creatinine, Ser: 1.4 mg/dL — ABNORMAL HIGH (ref 0.61–1.24)
GFR calc Af Amer: 50 mL/min — ABNORMAL LOW (ref >60)
GFR calc non Af Amer: 43 mL/min — ABNORMAL LOW (ref >60)
Glucose, Bld: 148 mg/dL — ABNORMAL HIGH (ref 65–99)
POTASSIUM: 3.9 mmol/L (ref 3.5–5.1)
Sodium: 140 mmol/L (ref 135–145)

## 2015-04-15 LAB — GLUCOSE, CAPILLARY
GLUCOSE-CAPILLARY: 128 mg/dL — AB (ref 65–99)
GLUCOSE-CAPILLARY: 137 mg/dL — AB (ref 65–99)
GLUCOSE-CAPILLARY: 119 mg/dL — AB (ref 65–99)
Glucose-Capillary: 157 mg/dL — ABNORMAL HIGH (ref 65–99)

## 2015-04-15 MED ORDER — ENSURE ENLIVE PO LIQD
237.0000 mL | Freq: Three times a day (TID) | ORAL | Status: DC
  Administered 2015-04-15 – 2015-04-17 (×5): 237 mL via ORAL

## 2015-04-15 NOTE — Progress Notes (Signed)
Patient Demographics  Douglas May, is a 81 y.o. male, DOB - 04/06/1924, [id number]  Admit date - 04/12/2015   Admitting Physician Rolly Salter, MD  Outpatient Primary MD for the patient is Lupe Carney, MD  LOS - 3   Chief Complaint  Patient presents with  . Altered Mental Status  . Gait Problem       Admission HPI/Brief narrative: Douglas May is 81 year old male, with past medical history of CVA with residual weakness, ABDs mellitus, coronary artery disease, dyslipidemia, dementia, patient was brought by his daughter for progressive decline in functional capacity over the last week, daughter reports patient has significant worsening of mentation since his CVA 2013, but had an another setback in December 2015, where he has been on continuous decline since, she reports more frequent episodes of agitation, confusion, in ED workup was significant for clinical dehydration, mild renal failure, patient reports multiple falls in the past, finding of new right rib fracture.   Subjective:   Douglas May today confused, does not answer questions appropriately, cannot provide any complaints . Patient had another episode of agitation this morning, required Haldol.  Assessment & Plan    Principal Problem:   AKI (acute kidney injury) Active Problems:   HTN (hypertension)   Diabetes mellitus type 2, controlled   Hx of CABG   CVA  05/2012 in [place]   Cerebral microvascular disease   Mitral valve regurgitation, moderate   Rib fractures   Fall   Dementia with behavioral disturbance   acute renal failure - This is secondary to volume depletion from poor oral intake and dehydration - Continue with IV fluids - Continue to hold ibuProfen and ARB - Creatinine still elevated at 1.4 today  Altered mental status/dementia - Appears to be delirium superimposed on worsening progressive  baseline dementia. - Daughter reports a gradual worsening of mentation over few month, with more frequent episodes of agitation. - We'll continue with Klonopin as needed, appears to be better controlled on Seroquel to 25 mg 3 times a day.  Fall/failure to thrive - Patient appears to be having multiple falls in the past, currently having right rib fracture from fall. - continue with PT. - Will start on Ensure 3 times a day  Hypertension - Acceptable, continue with metoprolol  Diabetes mellitus - Continue to hold oral hypoglycemic agents, continue with insulin sliding scale  History of CVA - Continue with aspirin and statin     Code Status: DO NOT RESUSCITATE   Family Communication: Spoke with daughter 04/14/15  Disposition Plan: SNF discharge in 1-2 days   Procedures  None   Consults   None    Medications  Scheduled Meds: . aspirin EC  81 mg Oral Daily  . atorvastatin  40 mg Oral Daily  . docusate sodium  100 mg Oral BID  . feeding supplement (ENSURE ENLIVE)  237 mL Oral TID BM  . ferrous sulfate  325 mg Oral Q breakfast  . haloperidol lactate  2 mg Intramuscular Once  . heparin  5,000 Units Subcutaneous 3 times per day  . insulin aspart  0-5 Units Subcutaneous QHS  . insulin aspart  0-9 Units Subcutaneous TID WC  . loratadine  10 mg Oral Daily  .  metoprolol succinate  50 mg Oral Daily  . QUEtiapine  25 mg Oral TID   Continuous Infusions: . sodium chloride 50 mL/hr at 04/15/15 1143   PRN Meds:.acetaminophen **OR** acetaminophen, clonazePAM, haloperidol lactate, ondansetron **OR** ondansetron (ZOFRAN) IV, traMADol-acetaminophen  DVT Prophylaxis   Heparin   Lab Results  Component Value Date   PLT 244 04/13/2015    Antibiotics    Anti-infectives    None          Objective:   Filed Vitals:   04/14/15 1331 04/14/15 2130 04/15/15 0500 04/15/15 1408  BP: 112/52 111/59 111/47 111/57  Pulse: 85 85 81 79  Temp: 99.2 F (37.3 C) 97.5 F (36.4 C)  99.3 F (37.4 C) 98.4 F (36.9 C)  TempSrc: Axillary Axillary Axillary Oral  Resp: 20 18 16 18   Height:      Weight:      SpO2: 97% 97% 100% 94%    Wt Readings from Last 3 Encounters:  04/13/15 67.178 kg (148 lb 1.6 oz)  11/22/14 67.132 kg (148 lb)  09/14/12 66.724 kg (147 lb 1.6 oz)     Intake/Output Summary (Last 24 hours) at 04/15/15 1512 Last data filed at 04/15/15 1012  Gross per 24 hour  Intake    640 ml  Output      0 ml  Net    640 ml     Physical Exam  Awake Alert,  Confused PERRAL Supple Neck,No JVD, No cervical lymphadenopathy appriciated.  Symmetrical Chest wall movement, CTAB RRR,No Gallops,Rubs or new Murmurs, No Parasternal Heave +ve B.Sounds, Abd Soft, No tenderness, No organomegaly appriciated, No rebound - guarding or rigidity. No Cyanosis, Clubbing or edema, No new Rash or bruise  , patient has deformed left shoulder from previous fracture .   Data Review   Micro Results No results found for this or any previous visit (from the past 240 hour(s)).  Radiology Reports Dg Chest 2 View  04/06/2015   CLINICAL DATA:  Right lateral chest wall pain.  Cough for 2 days.  EXAM: CHEST  2 VIEW  COMPARISON:  12/22/2014  FINDINGS: There is a hiatal hernia. There is moderate unchanged cardiomegaly and aortic tortuosity. The lungs are clear. The pulmonary vasculature is normal. There are no effusions.  Incidentally noted chronic override a left humeral fracture.  IMPRESSION: Cardiomegaly.  Hiatal hernia.  No acute cardiopulmonary findings.   Electronically Signed   By: Ellery Plunkaniel R Mitchell M.D.   On: 04/06/2015 05:12   Dg Ribs Unilateral W/chest Right  04/12/2015   CLINICAL DATA:  Patient found on floor of home. Generalize right-sided pain. Dementia. Uncooperative and combative  EXAM: RIGHT RIBS AND CHEST - 3+ VIEW  COMPARISON:  04/06/2015  FINDINGS: There is a normal heart size. Previous median sternotomy and CABG procedure. No pleural effusion or edema noted. Scar  identified in the right base. Chronic fracture deformity involving the proximal left humerus is again noted with over ride of the fracture fragments. 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 There are acute fractures involving the right anterior 7th, 8th, and 9th ribs. Marked scoliosis deformity involves the thoracic and lumbar spine.  IMPRESSION: 1. Acute right anterior rib fractures. 2. Chronic fracture deformity involves the proximal left humerus.   Electronically Signed   By: Signa Kellaylor  Stroud M.D.   On: 04/12/2015 18:39   Ct Head Wo Contrast  04/12/2015   CLINICAL DATA:  Altered mental status.  Gait problem.  EXAM: CT HEAD WITHOUT CONTRAST  TECHNIQUE: Contiguous axial  images were obtained from the base of the skull through the vertex without intravenous contrast.  COMPARISON:  09/12/2012  FINDINGS: Encephalomalacia within the left occipital lobe is again noted compatible with previous infarct. There is diffuse low attenuation throughout the subcortical and periventricular white matter compatible with chronic microvascular disease. Old left alignment lacunar infarct is again noted and appears unchanged. Chronic bilateral lacunar infarcts within the basal ganglia are noted. No evidence for acute cortical stroke, intracranial hemorrhage or mass. Prominence of the sulci and ventricles noted consistent with brain atrophy. The mastoid air cells and the paranasal sinuses are clear. The calvarium is intact.  IMPRESSION: 1. Small vessel ischemic disease and brain atrophy. 2. Left occipital lobe encephalomalacia compatible with old infarct. 3. No acute intracranial abnormalities.   Electronically Signed   By: Signa Kellaylor  Stroud M.D.   On: 04/12/2015 18:54   Dg Hip Unilat With Pelvis 2-3 Views Right  04/12/2015   CLINICAL DATA:  Right-sided hip pain.  EXAM: RIGHT HIP (WITH PELVIS) 2-3 VIEWS  COMPARISON:  None.  FINDINGS: Severe degenerative joint disease is seen involving both hip joints. No definite  fracture or dislocation is noted. Vascular calcifications are seen.  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 No acute abnormality seen in the right hip.   Electronically Signed   By: Lupita RaiderJames  Green Jr, M.D.   On: 04/12/2015 18:33     CBC  Recent Labs Lab 04/12/15 1717 04/13/15 0337  WBC 6.6 6.6  HGB 12.0* 10.6*  HCT 36.9* 32.5*  PLT 236 244  MCV 96.6 95.6  MCH 31.4 31.2  MCHC 32.5 32.6  RDW 13.3 13.1  LYMPHSABS 1.6  --   MONOABS 0.5  --   EOSABS 0.2  --   BASOSABS 0.0  --     Chemistries   Recent Labs Lab 04/12/15 1717 04/13/15 0337 04/15/15 0653  NA 138 140 140  K 5.2* 3.8 3.9  CL 106 108 108  CO2 24 23 23   GLUCOSE 206* 120* 148*  BUN 25* 21* 14  CREATININE 1.41* 1.34* 1.40*  CALCIUM 8.8* 8.5* 8.1*  AST  --  23  --   ALT  --  14*  --   ALKPHOS  --  78  --   BILITOT  --  0.7  --    ------------------------------------------------------------------------------------------------------------------ estimated creatinine clearance is 32.9 mL/min (by C-G formula based on Cr of 1.4). ------------------------------------------------------------------------------------------------------------------ No results for input(s): HGBA1C in the last 72 hours. ------------------------------------------------------------------------------------------------------------------ No results for input(s): CHOL, HDL, LDLCALC, TRIG, CHOLHDL, LDLDIRECT in the last 72 hours. ------------------------------------------------------------------------------------------------------------------ No results for input(s): TSH, T4TOTAL, T3FREE, THYROIDAB in the last 72 hours.  Invalid input(s): FREET3 ------------------------------------------------------------------------------------------------------------------ No results for input(s): VITAMINB12, FOLATE, FERRITIN, TIBC, IRON, RETICCTPCT in the last 72 hours.  Coagulation profile No results for input(s): INR, PROTIME in the last  168 hours.  No results for input(s): DDIMER in the last 72 hours.  Cardiac Enzymes No results for input(s): CKMB, TROPONINI, MYOGLOBIN in the last 168 hours.  Invalid input(s): CK ------------------------------------------------------------------------------------------------------------------ Invalid input(s): POCBNP     Time Spent in minutes   25 minutes   Anavi Branscum M.D on 04/15/2015 at 3:12 PM  Between 7am to 7pm - Pager - [phone]  After 7pm go to [email or web address] - password [email or web address]  [facility]   Office  [phone]

## 2015-04-15 NOTE — Progress Notes (Signed)
Physical Therapy Treatment Patient Details Name: Douglas ShutterCharles F May MRN: [id number] DOB: February 21, 1924 Today's Date: 04/15/2015    History of Present Illness Mr. Douglas DuralCharles May is 81 year old male, with past medical history of CVA with residual weakness, ABDs mellitus, coronary artery disease, dyslipidemia, dementia, patient was brought by his daughter for progressive decline in functional capacity. Found to have acute kidney injury and right rib fractures.    PT Comments    Improved participation and communication today answering some questions appropriately although daughter reports pt cognition still altered from baseline. Mr. Douglas May tolerated transfer training and therapeutic exercises today. Seems glad to be out of bed. Required Mod assist for mobility and max verbal cues for instructions. Patient will continue to benefit from skilled physical therapy services to further improve independence with functional mobility.   Follow Up Recommendations  SNF;Supervision/Assistance - 24 hour     Equipment Recommendations  None recommended by PT    Recommendations for Other Services       Precautions / Restrictions Precautions Precautions: Fall Restrictions Weight Bearing Restrictions: No    Mobility  Bed Mobility Overal bed mobility: Needs Assistance Bed Mobility: Supine to Sit     Supine to sit: Mod assist     General bed mobility comments: Mod assist with max VC and facilitory techniques for sequencing to bring LEs off bed. Some truncal assist to assume seated position. Able to scoot self to edge of bed. Needs cues for task at hand frequently.  Transfers Overall transfer level: Needs assistance Equipment used: Rolling walker (2 wheeled) Transfers: Sit to/from [employer] CorporationStand;Stand Pivot Transfers Sit to Stand: Mod assist;From elevated surface Stand pivot transfers: Min assist;+2 safety/equipment       General transfer comment: Verbal and tactile cues to facilitate stand from  elevated bed surface. Mod assist for boost and balance. Leans posteriorly. Max cues for upright posture and min assist for walker control with pivot demonstrating some difficulty sequencing pivotal steps to [facility]. Performed again from [facility] to recliner. Very flexed posture, guarding Rt side but denies Rib pain.  Ambulation/Gait                 Stairs            Wheelchair Mobility    Modified Rankin (Stroke Patients Only)       Balance Overall balance assessment: History of Falls;Needs assistance Sitting-balance support: Bilateral upper extremity supported Sitting balance-Leahy Scale: Poor     Standing balance support: Bilateral upper extremity supported Standing balance-Leahy Scale: Poor                      Cognition Arousal/Alertness: Awake/alert Behavior During Therapy: Restless Overall Cognitive Status: Impaired/Different from baseline (Hx of cognitive impairment) Area of Impairment: Following commands;Memory;Safety/judgement;Problem solving       Following Commands: Follows one step commands inconsistently;Follows one step commands with increased time Safety/Judgement: Decreased awareness of safety;Decreased awareness of deficits   Problem Solving: Slow processing;Decreased initiation;Difficulty sequencing;Requires verbal cues;Requires tactile cues General Comments: Daughter present, states he was much more conversant and appropriate prior to admission. Today is first day he as appropriately spoken with her.    Exercises General Exercises - Lower Extremity Long Arc Quad: Strengthening;Both;10 reps;Seated Hip Flexion/Marching: Strengthening;Both;20 reps;Seated    General Comments General comments (skin integrity, edema, etc.): Daughter present and actively participated in therapy session today, encouraging pt to perform exercises. Daughter states she allows pt to sit on a pressure relief cushion at home, encouraged her to bring in  for use.       Pertinent Vitals/Pain Pain Assessment: Faces Faces Pain Scale: Hurts little more Pain Location: Grimacing at times with transfers. States no pain but states I'm always in pain, i've been through a lot. Pain Descriptors / Indicators: Grimacing Pain Intervention(s): Monitored during session;Repositioned    Home Living                      Prior Function            PT Goals (current goals can now be found in the care plan section) Acute Rehab PT Goals Patient Stated Goal: none stated PT Goal Formulation: Patient unable to participate in goal setting Time For Goal Achievement: 04/27/15 Potential to Achieve Goals: Fair Progress towards PT goals: Progressing toward goals    Frequency  Min 2X/week    PT Plan Current plan remains appropriate    Co-evaluation             End of Session Equipment Utilized During Treatment: Gait belt Activity Tolerance: Patient limited by fatigue Patient left: with call bell/phone within reach;with nursing/sitter in room;in chair;with family/visitor present     Time: [id number] PT Time Calculation (min) (ACUTE ONLY): 37 min  Charges:  $Therapeutic Activity: 23-37 mins                    G CodesBerton Mount:      Dexton Zwilling S 04/15/2015, 6:20 PM Sunday SpillersLogan Secor Paulden[place]bour, [place]PT [phone]

## 2015-04-15 NOTE — Clinical Social Work Note (Signed)
Clinical Social Worker had lengthy discussion with pt's dtr, Douglas May in reference to post-acute placement for SNF. CSW presented bed offers and explained LOG process. [facility] however is aware that Douglas May currently does not have male beds available.   CSW will follow up with Douglas May at time of discharge for bed availability. LOG has been approved by [facility] ge[facility]medical director however pt's dtr stated she is willing to pay privately until insurance approves SNF stay. Pt's dtr is not pleased with additional bed offers extended.   CSW will continue to follow pt and pt's family for continued support and to facilitate pt's discharge needs once medically stable.   [name], MSW, LCSWA [phone] 04/15/2015 11:53 AM

## 2015-04-15 NOTE — Clinical Social Work Note (Signed)
FL-2 and DNR form on chart for MD signature.   Douglas May, MSW, LCSWA [phone] 04/15/2015 11:54 AM

## 2015-04-16 LAB — GLUCOSE, CAPILLARY
Glucose-Capillary: 120 mg/dL — ABNORMAL HIGH (ref 65–99)
Glucose-Capillary: 156 mg/dL — ABNORMAL HIGH (ref 65–99)
Glucose-Capillary: 185 mg/dL — ABNORMAL HIGH (ref 65–99)

## 2015-04-16 NOTE — Progress Notes (Signed)
CSW met with patient's daughter Ilona Sorrel for extensive discussion of d/c needs today.  Per Dr. Waldron Labs- the sitter has been d/c'd for patient today and he should be stable for d/c tomorrow.  Patient is not trying to get out of bed or wander- however- when his daughter is out of his sight be begins to call for her. He is alert but person and place- orients for short periods of time only.  Daughter is hoping for placement at [facility] and is willing to pay self pay at the SNF until [facility] can be obtained. CSW spoke to [place] [facility] today. She stated that at this time there is no vacancies in the building but there may be a male patient leaving to go home tomorrow. She will discuss with Deirdre Pippins, Admissions in the morning. Discussed with daughter who remains hopeful for that they will have a bed for her father.  CSW discussed the need for daughter to consider other SNF options in case [facility] does not get a bed.  Weekend CSW updated patient's information and re-sent it to facilities.  Daughter requests that CSW check with Sena[facility] Lange, [facility] Flock and [facility] tomorrow for possible bed offers.  She was noted to be cooperative and appreciative of CSW's visit and information provided. Daughter relates that she has been taking care of her father in her home since December and has noticed a significant decline in his mentation over time.  She expressed feelings of exhaustion and frustration over her attempts to provide care for her father but still hopes he will rehab enough that she can bring him home.  Will ask weekday CSW to follow up with [facility] in the a.m to determine possible bed availability.  Plan d/c to SNF tomorrow if patient is stable per MD.  Butch Penny T. Pauline Good, [place]

## 2015-04-16 NOTE — Progress Notes (Signed)
Patient Demographics  Douglas May, is a 81 y.o. male, DOB - 1924/06/26, ZOX:[id number]  Admit date - 04/12/2015   Admitting Physician Rolly SalterPranav M Patel, MD  Outpatient Primary MD for the patient is Lupe Carneyean Mitchell, MD  LOS - 4   Chief Complaint  Patient presents with  . Altered Mental Status  . Gait Problem       Admission HPI/Brief narrative: Mr. Douglas May is 81 year old male, with past medical history of CVA with residual weakness, ABDs mellitus, coronary artery disease, dyslipidemia, dementia, patient was brought by his daughter for progressive decline in functional capacity over the last week, daughter reports patient has significant worsening of mentation since his CVA 2013, but had an another setback in December 2015, where he has been on continuous decline since, she reports more frequent episodes of agitation, confusion, in ED workup was significant for clinical dehydration, mild renal failure, patient reports multiple falls in the past, finding of new right rib fracture.   Subjective:   Douglas May today confused, does not answer questions appropriately, cannot provide any complaints . No significant episodes of agitation overnight , safety sitter was discontinued this morning . Assessment & Plan    Principal Problem:   AKI (acute kidney injury) Active Problems:   HTN (hypertension)   Diabetes mellitus type 2, controlled   Hx of CABG   [place]   Cerebral microvascular disease   Mitral valve regurgitation, moderate   Rib fractures   Fall   Dementia with behavioral disturbance   acute renal failure - This is secondary to volume depletion from poor oral intake and dehydration - Continue with IV fluids for today. - Continue to hold ibuProfen and ARB   Altered mental status/dementia - Appears to be delirium superimposed on worsening progressive baseline  dementia. - Daughter reports a gradual worsening of mentation over few month, with more frequent episodes of agitation. - We'll continue with Klonopin as needed, appears to be better controlled on Seroquel to 25 mg 3 times a day.  Fall/failure to thrive - Patient appears to be having multiple falls in the past, currently having right rib fracture from fall. -  on Ensure 3 times a day  Hypertension -Blood pressure is  Acceptable, continue with metoprolol  Diabetes mellitus - Continue to hold oral hypoglycemic agents, continue with insulin sliding scale  History of CVA - Continue with aspirin and statin     Code Status: DO NOT RESUSCITATE   Family Communication: Spoke with daughter 04/14/15  Disposition Plan: SNF discharge after appropriate IV fluid hydration.  Procedures  None   Consults   None    Medications  Scheduled Meds: . aspirin EC  81 mg Oral Daily  . atorvastatin  40 mg Oral Daily  . docusate sodium  100 mg Oral BID  . feeding supplement (ENSURE ENLIVE)  237 mL Oral TID BM  . ferrous sulfate  325 mg Oral Q breakfast  . haloperidol lactate  2 mg Intramuscular Once  . heparin  5,000 Units Subcutaneous 3 times per day  . insulin aspart  0-5 Units Subcutaneous QHS  . insulin aspart  0-9 Units Subcutaneous TID WC  . loratadine  10 mg Oral Daily  . metoprolol  succinate  50 mg Oral Daily  . QUEtiapine  25 mg Oral TID   Continuous Infusions: . sodium chloride 75 mL/hr at 04/16/15 0203   PRN Meds:.acetaminophen **OR** acetaminophen, clonazePAM, haloperidol lactate, ondansetron **OR** ondansetron (ZOFRAN) IV, traMADol-acetaminophen  DVT Prophylaxis   Heparin   Lab Results  Component Value Date   PLT 244 04/13/2015    Antibiotics    Anti-infectives    None          Objective:   Filed Vitals:   04/15/15 1408 04/15/15 1647 04/15/15 2218 04/16/15 0659  BP: 111/57 128/64 131/90 125/59  Pulse: 79 82 94 79  Temp: 98.4 F (36.9 C) 98.1 F (36.7 C)  98.2 F (36.8 C) 98 F (36.7 C)  TempSrc: Oral Axillary Axillary Axillary  Resp: 18 16 16 16   Height:      Weight:      SpO2: 94% 100% 100% 98%    Wt Readings from Last 3 Encounters:  04/13/15 67.178 kg (148 lb 1.6 oz)  11/22/14 67.132 kg (148 lb)  09/14/12 66.724 kg (147 lb 1.6 oz)     Intake/Output Summary (Last 24 hours) at 04/16/15 1344 Last data filed at 04/16/15 0900  Gross per 24 hour  Intake 2716.25 ml  Output    850 ml  Net 1866.25 ml     Physical Exam  Awake Alert,  Confused PERRAL Supple Neck,No JVD, No cervical lymphadenopathy appriciated.  Symmetrical Chest wall movement, CTAB RRR,No Gallops,Rubs or new Murmurs, No Parasternal Heave +ve B.Sounds, Abd Soft, No tenderness, No organomegaly appriciated, No rebound - guarding or rigidity. No Cyanosis, Clubbing or edema, No new Rash or bruise  , patient has deformed left shoulder from previous fracture .   Data Review   Micro Results No results found for this or any previous visit (from the past 240 hour(s)).  Radiology Reports Dg Chest 2 View  04/06/2015   CLINICAL DATA:  Right lateral chest wall pain.  Cough for 2 days.  EXAM: CHEST  2 VIEW  COMPARISON:  12/22/2014  FINDINGS: There is a hiatal hernia. There is moderate unchanged cardiomegaly and aortic tortuosity. The lungs are clear. The pulmonary vasculature is normal. There are no effusions.  Incidentally noted chronic override a left humeral fracture.  IMPRESSION: Cardiomegaly.  Hiatal hernia.  No acute cardiopulmonary findings.   Electronically Signed   By: Ellery Plunkaniel R Mitchell M.D.   On: 04/06/2015 05:12   Dg Ribs Unilateral W/chest Right  04/12/2015   CLINICAL DATA:  Patient found on floor of home. Generalize right-sided pain. Dementia. Uncooperative and combative  EXAM: RIGHT RIBS AND CHEST - 3+ VIEW  COMPARISON:  04/06/2015  FINDINGS: There is a normal heart size. Previous median sternotomy and CABG procedure. No pleural effusion or edema noted. Scar  identified in the right base. Chronic fracture deformity involving the proximal left humerus is again noted with over ride of the fracture fragments. 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 There are acute fractures involving the right anterior 7th, 8th, and 9th ribs. Marked scoliosis deformity involves the thoracic and lumbar spine.  IMPRESSION: 1. Acute right anterior rib fractures. 2. Chronic fracture deformity involves the proximal left humerus.   Electronically Signed   By: Signa Kellaylor  Stroud M.D.   On: 04/12/2015 18:39   Ct Head Wo Contrast  04/12/2015   CLINICAL DATA:  Altered mental status.  Gait problem.  EXAM: CT HEAD WITHOUT CONTRAST  TECHNIQUE: Contiguous axial images were obtained from the base of the skull  through the vertex without intravenous contrast.  COMPARISON:  09/12/2012  FINDINGS: Encephalomalacia within the left occipital lobe is again noted compatible with previous infarct. There is diffuse low attenuation throughout the subcortical and periventricular white matter compatible with chronic microvascular disease. Old left alignment lacunar infarct is again noted and appears unchanged. Chronic bilateral lacunar infarcts within the basal ganglia are noted. No evidence for acute cortical stroke, intracranial hemorrhage or mass. Prominence of the sulci and ventricles noted consistent with brain atrophy. The mastoid air cells and the paranasal sinuses are clear. The calvarium is intact.  IMPRESSION: 1. Small vessel ischemic disease and brain atrophy. 2. Left occipital lobe encephalomalacia compatible with old infarct. 3. No acute intracranial abnormalities.   Electronically Signed   By: Signa Kell M.D.   On: 04/12/2015 18:54   Dg Hip Unilat With Pelvis 2-3 Views Right  04/12/2015   CLINICAL DATA:  Right-sided hip pain.  EXAM: RIGHT HIP (WITH PELVIS) 2-3 VIEWS  COMPARISON:  None.  FINDINGS: Severe degenerative joint disease is seen involving both hip joints. No definite  fracture or dislocation is noted. Vascular calcifications are seen.  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 No acute abnormality seen in the right hip.   Electronically Signed   By: Lupita Raider, M.D.   On: 04/12/2015 18:33     CBC  Recent Labs Lab 04/12/15 1717 04/13/15 0337  WBC 6.6 6.6  HGB 12.0* 10.6*  HCT 36.9* 32.5*  PLT 236 244  MCV 96.6 95.6  MCH 31.4 31.2  MCHC 32.5 32.6  RDW 13.3 13.1  LYMPHSABS 1.6  --   MONOABS 0.5  --   EOSABS 0.2  --   BASOSABS 0.0  --     Chemistries   Recent Labs Lab 04/12/15 1717 04/13/15 0337 04/15/15 0653  NA 138 140 140  K 5.2* 3.8 3.9  CL 106 108 108  CO2 GLUCOSE 206* 120* 148*  BUN 25* 21* 14  CREATININE 1.41* 1.34* 1.40*  CALCIUM 8.8* 8.5* 8.1*  AST  --  23  --   ALT  --  14*  --   ALKPHOS  --  78  --   BILITOT  --  0.7  --    ------------------------------------------------------------------------------------------------------------------ estimated creatinine clearance is 32.9 mL/min (by C-G formula based on Cr of 1.4). ------------------------------------------------------------------------------------------------------------------ No results for input(s): HGBA1C in the last 72 hours. ------------------------------------------------------------------------------------------------------------------ No results for input(s): CHOL, HDL, LDLCALC, TRIG, CHOLHDL, LDLDIRECT in the last 72 hours. ------------------------------------------------------------------------------------------------------------------ No results for input(s): TSH, T4TOTAL, T3FREE, THYROIDAB in the last 72 hours.  Invalid input(s): FREET3 ------------------------------------------------------------------------------------------------------------------ No results for input(s): VITAMINB12, FOLATE, FERRITIN, TIBC, IRON, RETICCTPCT in the last 72 hours.  Coagulation profile No results for input(s): INR, PROTIME in the last  168 hours.  No results for input(s): DDIMER in the last 72 hours.  Cardiac Enzymes No results for input(s): CKMB, TROPONINI, MYOGLOBIN in the last 168 hours.  Invalid input(s): CK ------------------------------------------------------------------------------------------------------------------ Invalid input(s): POCBNP     Time Spent in minutes   25 minutes   ELGERGAWY, DAWOOD M.D on 04/16/2015 at 1:44 PM  Between 7am to 7pm - Pager - [phone]  After 7pm go to [email or web address] - password [facility]  [facility]   Office  [phone]

## 2015-04-17 LAB — GLUCOSE, CAPILLARY: Glucose-Capillary: 123 mg/dL — ABNORMAL HIGH (ref 65–99)

## 2015-04-17 MED ORDER — CLONAZEPAM 0.5 MG PO TABS: 0.2500 mg | ORAL_TABLET | Freq: Three times a day (TID) | ORAL | Status: DC | PRN

## 2015-04-17 MED ORDER — QUETIAPINE FUMARATE 25 MG PO TABS: 25.0000 mg | ORAL_TABLET | Freq: Three times a day (TID) | ORAL | Status: DC

## 2015-04-17 MED ORDER — ENSURE ENLIVE PO LIQD: 237.0000 mL | Freq: Three times a day (TID) | ORAL | Status: DC

## 2015-04-17 MED ORDER — INSULIN ASPART 100 UNIT/ML ~~LOC~~ SOLN
0.0000 [IU] | Freq: Every day | SUBCUTANEOUS | Status: DC
Start: 2015-04-17 — End: 2015-05-01

## 2015-04-17 NOTE — Care Management Note (Signed)
Case Management Note  Patient Details  Name: Lupita ShutterCharles F Poon MRN: [id number] Date of Birth: 1924-11-06  Subjective/Objective:                    Action/Plan:   Expected Discharge Date:       04-17-15           Expected Discharge Plan:  Skilled Nursing Facility  In-House Referral:  Clinical Social Work  Discharge planning Services     Post Acute Care Choice:    Choice offered to:     DME Arranged:    DME Agency:     HH Arranged:    HH Agency:     Status of Service:  Completed, signed off  Medicare Important Message Given:    Date Medicare IM Given:  04/17/15 Medicare IM give by:  Ronny FlurryHeather Renell Coaxum RN BSN  Date Additional Medicare IM Given:    Additional Medicare Important Message give by:     If discussed at Long Length of Stay Meetings, dates discussed:    Additional Comments:  Kingsley PlanWile, Lailie Smead Marie, RN 04/17/2015, 11:29 AM

## 2015-04-17 NOTE — Discharge Summary (Signed)
Douglas May, is a 81 y.o. male  DOB 1924-06-03  MRN [id number].  Admission date:  04/12/2015  Admitting Physician  Rolly Salter, MD  Discharge Date:  04/17/2015   Primary MD  Douglas Carney, MD  Recommendations for primary care physician for things to follow:  - We'll check CBC, BMP during 3 days from discharge.  CODE STATUS: Patient is DO NOT RESUSCITATE Admission Diagnosis  Pain [R52] Closed fracture of multiple ribs, right, initial encounter [S22.41XA]   Discharge Diagnosis  Pain [R52] Closed fracture of multiple ribs, right, initial encounter [S22.41XA]    Principal Problem:   AKI (acute kidney injury) Active Problems:   HTN (hypertension)   Diabetes mellitus type 2, controlled   Hx of CABG   CVA  05/2012 in [place]   Cerebral microvascular disease   Mitral valve regurgitation, moderate   Rib fractures   Fall   Dementia with behavioral disturbance      Past Medical History  Diagnosis Date  . Stroke   . Diabetes mellitus without complication   . CAD (coronary artery disease)   . Hypertension   . High cholesterol   . TIA (transient ischemic attack)     Past Surgical History  Procedure Laterality Date  . Carotid endarterectomy    . Coronary artery bypass graft  05/2012  . Lumbar disc surgery  1960  . Ankle surgery  1996  . Wrist surgery  1984       History of present illness and  Hospital Course:     Kindly see H&P for history of present illness and admission details, please review complete Labs, Consult reports and Test reports for all details in brief  Hospital Course   Mr. Douglas May is 81 year old male, with past medical history of CVA with residual weakness, ABDs mellitus, coronary artery disease, dyslipidemia, dementia, patient was brought by his daughter for progressive decline in functional capacity over the last week, daughter reports patient has  significant worsening of mentation since his CVA 2013, but had an another setback in December 2015, where he has been on continuous decline since, she reports more frequent episodes of agitation, confusion, in ED workup was significant for clinical dehydration, mild renal failure, patient reports multiple falls in the past, finding of new right rib fracture.  acute renal failure - This is secondary to volume depletion from poor oral intake and dehydration - Treated with IV fluids during hospital stay, most recent creatinine is 1.4 - Continue to hold ibuProfen and ARB on discharge   Altered mental status/dementia - Appears to be delirium superimposed on worsening progressive baseline dementia. - Daughter reports a gradual worsening of mentation over few month, with more frequent episodes of agitation. - We'll continue with Klonopin as needed, appears to be better controlled on Seroquel to 25 mg 3 times a day. We'll discharge on charcoal 25 mg 3 times a day.  Fall/failure to thrive - Patient appears to be having multiple falls in the past, currently having right rib fracture  from fall. - on Ensure 3 times a day  Hypertension -Blood pressure is Acceptable, continue with metoprolol  Diabetes mellitus - We'll discharge on insulin sliding scale, Asian CBGs has been adequately controlled on this regimen.  History of CVA - Continue with aspirin and statin     Discharge Condition: Stable   Follow UP  Follow-up Information    Follow up with Douglas Carneyean Mitchell, MD. Call in 1 week.   Specialty:  Family Medicine   Why:  If not seen by skilled nursing facility physician   Contact information:   [address] [place] [address] [phone]         Discharge Instructions  and  Discharge Medications     Discharge Instructions    Diet - low sodium heart healthy    Complete by:  As directed      Discharge instructions    Complete by:  As directed   Follow with  Primary MD Douglas Carneyean Mitchell, MD in 7 days   Get CBC, CMP, 2 view Chest X ray checked  by Primary MD next visit.    Activity: As tolerated with Full fall precautions use walker/cane & assistance as needed   Disposition SNF   Diet: Heart Healthy  , with feeding assistance and aspiration precautions.  For Heart failure patients - Check your Weight same time everyday, if you gain over 2 pounds, or you develop in leg swelling, experience more shortness of breath or chest pain, call your Primary MD immediately. Follow Cardiac Low Salt Diet and 1.5 lit/day fluid restriction.   On your next visit with your primary care physician please Get Medicines reviewed and adjusted.   Please request your Prim.MD to go over all Hospital Tests and Procedure/Radiological results at the follow up, please get all Hospital records sent to your Prim MD by signing hospital release before you go home.   If you experience worsening of your admission symptoms, develop shortness of breath, life threatening emergency, suicidal or homicidal thoughts you must seek medical attention immediately by calling 911 or calling your MD immediately  if symptoms less severe.  You Must read complete instructions/literature along with all the possible adverse reactions/side effects for all the Medicines you take and that have been prescribed to you. Take any new Medicines after you have completely understood and accpet all the possible adverse reactions/side effects.   Do not drive, operating heavy machinery, perform activities at heights, swimming or participation in water activities or provide baby sitting services if your were admitted for syncope or siezures until you have seen by Primary MD or a Neurologist and advised to do so again.  Do not drive when taking Pain medications.    Do not take more than prescribed Pain, Sleep and Anxiety Medications  Special Instructions: If you have smoked or chewed Tobacco  in the last 2 yrs  please stop smoking, stop any regular Alcohol  and or any Recreational drug use.  Wear Seat belts while driving.   Please note  You were cared for by a hospitalist during your hospital stay. If you have any questions about your discharge medications or the care you received while you were in the hospital after you are discharged, you can call the unit and asked to speak with the hospitalist on call if the hospitalist that took care of you is not available. Once you are discharged, your primary care physician will handle any further medical issues. Please note that NO REFILLS for  any discharge medications will be authorized once you are discharged, as it is imperative that you return to your primary care physician (or establish a relationship with a primary care physician if you do not have one) for your aftercare needs so that they can reassess your need for medications and monitor your lab values.            Medication List    STOP taking these medications        hydrocortisone cream 1 %     ibuprofen 200 MG tablet  Commonly known as:  ADVIL,MOTRIN     irbesartan 75 MG tablet  Commonly known as:  AVAPRO     LANTUS SOLOSTAR 100 UNIT/ML Solostar Pen  Generic drug:  Insulin Glargine      TAKE these medications        acetaminophen 325 MG tablet  Commonly known as:  TYLENOL  Take 650 mg by mouth Every 4 hours as needed     alum & mag hydroxide-simeth 200-200-20 MG/5ML suspension  Commonly known as:  MAALOX/MYLANTA  Take 30 mLs by mouth every 6 (six) hours as needed for indigestion or heartburn.     aspirin 81 MG tablet  Take 81 mg by mouth daily.     atorvastatin 40 MG tablet  Commonly known as:  LIPITOR  Take 40 mg by mouth daily.     beta carotene w/minerals tablet  Take 1 tablet by mouth 2 (two) times daily.     CALCIUM-VITAMIN D PO  Take 1 tablet by mouth daily.     cetirizine 10 MG tablet  Commonly known as:  ZYRTEC  Take 10 mg by mouth daily.     clonazePAM  0.5 MG tablet  Commonly known as:  KLONOPIN  Take 0.5 tablets (0.25 mg total) by mouth 3 (three) times daily as needed (anxiety).     clotrimazole-betamethasone cream  Commonly known as:  LOTRISONE  Apply topically Twice daily     docusate sodium 100 MG capsule  Commonly known as:  COLACE  Take 50 mg by mouth 2 (two) times daily.     feeding supplement (ENSURE ENLIVE) Liqd  Take 237 mLs by mouth 3 (three) times daily between meals.     ferrous sulfate 325 (65 FE) MG tablet  Take 325 mg by mouth daily with breakfast.     insulin aspart 100 UNIT/ML injection  Commonly known as:  novoLOG  Inject 0-5 Units into the skin at bedtime.     metoprolol succinate 50 MG 24 hr tablet  Commonly known as:  TOPROL-XL  Take 50 mg by mouth daily. Take with or immediately following a meal.     multivitamin with minerals Tabs tablet  Take 1 tablet by mouth daily.     nitroGLYCERIN 0.4 MG SL tablet  Commonly known as:  NITROSTAT  Place 0.4 mg under the tongue every 5 (five) minutes as needed. Chest pain     QUEtiapine 25 MG tablet  Commonly known as:  SEROQUEL  Take 1 tablet (25 mg total) by mouth 3 (three) times daily.     traMADol 50 MG tablet  Commonly known as:  ULTRAM  Take 1 tablet (50 mg total) by mouth every 6 (six) hours as needed.          Diet and Activity recommendation: See Discharge Instructions above   Consults obtained -  None  Major procedures and Radiology Reports - PLEASE review detailed and final reports for all details, in  brief -      Dg Chest 2 View  04/06/2015   CLINICAL DATA:  Right lateral chest wall pain.  Cough for 2 days.  EXAM: CHEST  2 VIEW  COMPARISON:  12/22/2014  FINDINGS: There is a hiatal hernia. There is moderate unchanged cardiomegaly and aortic tortuosity. The lungs are clear. The pulmonary vasculature is normal. There are no effusions.  Incidentally noted chronic override a left humeral fracture.  IMPRESSION: Cardiomegaly.  Hiatal hernia.   No acute cardiopulmonary findings.   Electronically Signed   By: Ellery Plunk M.D.   On: 04/06/2015 05:12   Dg Ribs Unilateral W/chest Right  04/12/2015   CLINICAL DATA:  Patient found on floor of home. Generalize right-sided pain. Dementia. Uncooperative and combative  EXAM: RIGHT RIBS AND CHEST - 3+ VIEW  COMPARISON:  04/06/2015  FINDINGS: There is a normal heart size. Previous median sternotomy and CABG procedure. No pleural effusion or edema noted. Scar identified in the right base. Chronic fracture deformity involving the proximal left humerus is again noted with over ride of the fracture fragments. 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 There are acute fractures involving the right anterior 7th, 8th, and 9th ribs. Marked scoliosis deformity involves the thoracic and lumbar spine.  IMPRESSION: 1. Acute right anterior rib fractures. 2. Chronic fracture deformity involves the proximal left humerus.   Electronically Signed   By: Signa Kell M.D.   On: 04/12/2015 18:39   Ct Head Wo Contrast  04/12/2015   CLINICAL DATA:  Altered mental status.  Gait problem.  EXAM: CT HEAD WITHOUT CONTRAST  TECHNIQUE: Contiguous axial images were obtained from the base of the skull through the vertex without intravenous contrast.  COMPARISON:  09/12/2012  FINDINGS: Encephalomalacia within the left occipital lobe is again noted compatible with previous infarct. There is diffuse low attenuation throughout the subcortical and periventricular white matter compatible with chronic microvascular disease. Old left alignment lacunar infarct is again noted and appears unchanged. Chronic bilateral lacunar infarcts within the basal ganglia are noted. No evidence for acute cortical stroke, intracranial hemorrhage or mass. Prominence of the sulci and ventricles noted consistent with brain atrophy. The mastoid air cells and the paranasal sinuses are clear. The calvarium is intact.  IMPRESSION: 1. Small vessel  ischemic disease and brain atrophy. 2. Left occipital lobe encephalomalacia compatible with old infarct. 3. No acute intracranial abnormalities.   Electronically Signed   By: Signa Kell M.D.   On: 04/12/2015 18:54   Dg Hip Unilat With Pelvis 2-3 Views Right  04/12/2015   CLINICAL DATA:  Right-sided hip pain.  EXAM: RIGHT HIP (WITH PELVIS) 2-3 VIEWS  COMPARISON:  None.  FINDINGS: Severe degenerative joint disease is seen involving both hip joints. No definite fracture or dislocation is noted. Vascular calcifications are seen.  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 No acute abnormality seen in the right hip.   Electronically Signed   By: Lupita Raider, M.D.   On: 04/12/2015 18:33    Micro Results     No results found for this or any previous visit (from the past 240 hour(s)).     Today   Subjective:   Micharl Helmes today confused, but appears more communicative, cannot provide any complaints . No significant episodes of agitation overnight .  Objective:   Blood pressure 232/54, pulse 71, temperature 98.8 F (37.1 C), temperature source Oral, resp. rate 18, height  (1.676 m), weight 67.178 kg (148 lb 1.6 oz),  SpO2 96 %.   Intake/Output Summary (Last 24 hours) at 04/17/15 1104 Last data filed at 04/17/15 0900  Gross per 24 hour  Intake 2031.25 ml  Output    175 ml  Net 1856.25 ml    Exam Awake Alert, Confused PERRAL Supple Neck,No JVD, No cervical lymphadenopathy appriciated.  Symmetrical Chest wall movement, CTAB RRR,No Gallops,Rubs or new Murmurs, No Parasternal Heave +ve B.Sounds, Abd Soft, No tenderness, No organomegaly appriciated, No rebound - guarding or rigidity. No Cyanosis, Clubbing or edema, No new Rash or bruise , patient has deformed left shoulder from previous fracture .  Data Review   CBC w Diff: Lab Results  Component Value Date   WBC 6.6 04/13/2015   HGB 10.6* 04/13/2015   HCT 32.5* 04/13/2015   PLT 244 04/13/2015     LYMPHOPCT 24 04/12/2015   MONOPCT 8 04/12/2015   EOSPCT 3 04/12/2015   BASOPCT 1 04/12/2015    CMP: Lab Results  Component Value Date   NA 140 04/15/2015   K 3.9 04/15/2015   CL 108 04/15/2015   CO2 23 04/15/2015   BUN 14 04/15/2015   CREATININE 1.40* 04/15/2015   PROT 5.4* 04/13/2015   ALBUMIN 3.0* 04/13/2015   BILITOT 0.7 04/13/2015   ALKPHOS 78 04/13/2015   AST 23 04/13/2015   ALT 14* 04/13/2015  .   Total Time in preparing paper work, data evaluation and todays exam - 35 minutes  Precilla Purnell M.D on 04/17/2015 at 11:04 AM  [facility]   Office  [phone]

## 2015-04-17 NOTE — Progress Notes (Signed)
Patient will discharge to [facility] Anticipated discharge date:04/17/15 Family notified:pt dtr Transportation by [facility]- called at 2pm  CSW signing off.  Merlyn LotJenna Holoman, LCSWA Clinical Social Worker [phone]

## 2015-04-17 NOTE — Progress Notes (Signed)
Patient has bed available at [facility] today- patient dtr to complete paperwork with facility at 2pm.  CSW will continue to follow.  Merlyn LotJenna Holoman, LCSWA Clinical Social Worker [phone]

## 2015-04-17 NOTE — Progress Notes (Signed)
[facility]

## 2015-04-17 NOTE — Progress Notes (Signed)
Ambulance here at this time to take pt to SNF rehab

## 2015-04-17 NOTE — Discharge Instructions (Signed)
Follow with Primary MD Lupe Carneyean Mitchell, MD in 7 days   Get CBC, CMP, 2 view Chest X ray checked  by Primary MD next visit.    Activity: As tolerated with Full fall precautions use walker/cane & assistance as needed   Disposition SNF   Diet: Heart Healthy  , with feeding assistance and aspiration precautions.  For Heart failure patients - Check your Weight same time everyday, if you gain over 2 pounds, or you develop in leg swelling, experience more shortness of breath or chest pain, call your Primary MD immediately. Follow Cardiac Low Salt Diet and 1.5 lit/day fluid restriction.   On your next visit with your primary care physician please Get Medicines reviewed and adjusted.   Please request your Prim.MD to go over all Hospital Tests and Procedure/Radiological results at the follow up, please get all Hospital records sent to your Prim MD by signing hospital release before you go home.   If you experience worsening of your admission symptoms, develop shortness of breath, life threatening emergency, suicidal or homicidal thoughts you must seek medical attention immediately by calling 911 or calling your MD immediately  if symptoms less severe.  You Must read complete instructions/literature along with all the possible adverse reactions/side effects for all the Medicines you take and that have been prescribed to you. Take any new Medicines after you have completely understood and accpet all the possible adverse reactions/side effects.   Do not drive, operating heavy machinery, perform activities at heights, swimming or participation in water activities or provide baby sitting services if your were admitted for syncope or siezures until you have seen by Primary MD or a Neurologist and advised to do so again.  Do not drive when taking Pain medications.    Do not take more than prescribed Pain, Sleep and Anxiety Medications  Special Instructions: If you have smoked or chewed Tobacco  in the  last 2 yrs please stop smoking, stop any regular Alcohol  and or any Recreational drug use.  Wear Seat belts while driving.   Please note  You were cared for by a hospitalist during your hospital stay. If you have any questions about your discharge medications or the care you received while you were in the hospital after you are discharged, you can call the unit and asked to speak with the hospitalist on call if the hospitalist that took care of you is not available. Once you are discharged, your primary care physician will handle any further medical issues. Please note that NO REFILLS for any discharge medications will be authorized once you are discharged, as it is imperative that you return to your primary care physician (or establish a relationship with a primary care physician if you do not have one) for your aftercare needs so that they can reassess your need for medications and monitor your lab values.

## 2015-04-28 ENCOUNTER — Emergency Department (HOSPITAL_COMMUNITY): Payer: Medicare PPO

## 2015-04-28 ENCOUNTER — Inpatient Hospital Stay (HOSPITAL_COMMUNITY)
Admission: EM | Admit: 2015-04-28 | Discharge: 2015-05-01 | DRG: 071 | Disposition: A | Discharge status: Skilled Nursing Facility | Payer: Medicare PPO | Attending: Internal Medicine | Admitting: Internal Medicine

## 2015-04-28 ENCOUNTER — Encounter (HOSPITAL_COMMUNITY): Payer: Self-pay

## 2015-04-28 DIAGNOSIS — F03918 Unspecified dementia, unspecified severity, with other behavioral disturbance: Secondary | ICD-10-CM | POA: Diagnosis present

## 2015-04-28 DIAGNOSIS — R627 Adult failure to thrive: Secondary | ICD-10-CM | POA: Diagnosis present

## 2015-04-28 DIAGNOSIS — Z66 Do not resuscitate: Secondary | ICD-10-CM | POA: Diagnosis present

## 2015-04-28 DIAGNOSIS — W19XXXA Unspecified fall, initial encounter: Secondary | ICD-10-CM | POA: Diagnosis present

## 2015-04-28 DIAGNOSIS — F0390 Unspecified dementia without behavioral disturbance: Secondary | ICD-10-CM | POA: Diagnosis present

## 2015-04-28 DIAGNOSIS — E119 Type 2 diabetes mellitus without complications: Secondary | ICD-10-CM

## 2015-04-28 DIAGNOSIS — Z7982 Long term (current) use of aspirin: Secondary | ICD-10-CM

## 2015-04-28 DIAGNOSIS — Z8673 Personal history of transient ischemic attack (TIA), and cerebral infarction without residual deficits: Secondary | ICD-10-CM

## 2015-04-28 DIAGNOSIS — F0391 Unspecified dementia with behavioral disturbance: Secondary | ICD-10-CM | POA: Diagnosis present

## 2015-04-28 DIAGNOSIS — R4182 Altered mental status, unspecified: Secondary | ICD-10-CM | POA: Diagnosis not present

## 2015-04-28 DIAGNOSIS — Z515 Encounter for palliative care: Secondary | ICD-10-CM | POA: Insufficient documentation

## 2015-04-28 DIAGNOSIS — E780 Pure hypercholesterolemia: Secondary | ICD-10-CM | POA: Diagnosis present

## 2015-04-28 DIAGNOSIS — G9340 Encephalopathy, unspecified: Secondary | ICD-10-CM | POA: Insufficient documentation

## 2015-04-28 DIAGNOSIS — R778 Other specified abnormalities of plasma proteins: Secondary | ICD-10-CM

## 2015-04-28 DIAGNOSIS — S61411A Laceration without foreign body of right hand, initial encounter: Secondary | ICD-10-CM

## 2015-04-28 DIAGNOSIS — Z885 Allergy status to narcotic agent status: Secondary | ICD-10-CM

## 2015-04-28 DIAGNOSIS — Z880 Allergy status to penicillin: Secondary | ICD-10-CM

## 2015-04-28 DIAGNOSIS — I2510 Atherosclerotic heart disease of native coronary artery without angina pectoris: Secondary | ICD-10-CM | POA: Diagnosis present

## 2015-04-28 DIAGNOSIS — Z887 Allergy status to serum and vaccine status: Secondary | ICD-10-CM

## 2015-04-28 DIAGNOSIS — E785 Hyperlipidemia, unspecified: Secondary | ICD-10-CM | POA: Diagnosis present

## 2015-04-28 DIAGNOSIS — Z87891 Personal history of nicotine dependence: Secondary | ICD-10-CM

## 2015-04-28 DIAGNOSIS — I10 Essential (primary) hypertension: Secondary | ICD-10-CM | POA: Diagnosis present

## 2015-04-28 DIAGNOSIS — S0003XA Contusion of scalp, initial encounter: Secondary | ICD-10-CM | POA: Diagnosis present

## 2015-04-28 DIAGNOSIS — R7989 Other specified abnormal findings of blood chemistry: Secondary | ICD-10-CM

## 2015-04-28 DIAGNOSIS — Z951 Presence of aortocoronary bypass graft: Secondary | ICD-10-CM

## 2015-04-28 LAB — CBC
HCT: 36.5 % — ABNORMAL LOW (ref 39.0–52.0)
HEMOGLOBIN: 11.8 g/dL — AB (ref 13.0–17.0)
MCH: 31.2 pg (ref 26.0–34.0)
MCHC: 32.3 g/dL (ref 30.0–36.0)
MCV: 96.6 fL (ref 78.0–100.0)
PLATELETS: 314 10*3/uL (ref 150–400)
RBC: 3.78 MIL/uL — ABNORMAL LOW (ref 4.22–5.81)
RDW: 13.3 % (ref 11.5–15.5)
WBC: 11.5 10*3/uL — ABNORMAL HIGH (ref 4.0–10.5)

## 2015-04-28 LAB — I-STAT CHEM 8, ED
BUN: 18 mg/dL (ref 6–20)
CALCIUM ION: 1.15 mmol/L (ref 1.13–1.30)
CREATININE: 1.2 mg/dL (ref 0.61–1.24)
Chloride: 105 mmol/L (ref 101–111)
Glucose, Bld: 191 mg/dL — ABNORMAL HIGH (ref 65–99)
HCT: 37 % — ABNORMAL LOW (ref 39.0–52.0)
HEMOGLOBIN: 12.6 g/dL — AB (ref 13.0–17.0)
POTASSIUM: 3.8 mmol/L (ref 3.5–5.1)
Sodium: 143 mmol/L (ref 135–145)
TCO2: 22 mmol/L (ref 0–100)

## 2015-04-28 LAB — I-STAT TROPONIN, ED: Troponin i, poc: 1.33 ng/mL (ref 0.00–0.08)

## 2015-04-28 NOTE — ED Notes (Addendum)
Per EMS, pt from [facility] on [address], pt was leaning forward to scratch his knee and he fell out of his wheelchair to the floor and hit his head. Pt has hematoma to the right side of the head, no loc. Pt is not on blood thinners. Pt has advanced dementia. Pt was recently seen for broken ribs here. Pt in no acute distress upon arrival. Pt's family states that pt has had major decline in dementia(mental status) over the last 2 weeks but pt is at baseline post fall for where he's been in the last 2 weeks. Pt has already taken seroquel for the night so pt is sleepy. Pt also has hx of right sided CVA in 2013.

## 2015-04-28 NOTE — ED Provider Notes (Signed)
CSN: [id number]     Arrival date & time 04/28/15  2135 History   First MD Initiated Contact with Patient 04/28/15 2146     Chief Complaint  Patient presents with  . Fall  . Head Injury   Level V caveat applies secondary to dementia  (Consider location/radiation/quality/duration/timing/severity/associated sxs/prior Treatment) HPI Comments: Patient is an 81 year old male with a history of dementia, stroke, diabetes mellitus, coronary artery disease, hypertension, and dyslipidemia who presents today for further evaluation of injuries following a fall. Daughter states the patient was sitting in his wheelchair and leaning forward touching his sock when he fell forward out of his wheelchair. Patient struck his head on the floor. Daughter denies loss of consciousness. Patient has had no nausea or vomiting since this time. He has been more sleepy as he took his nighttime pain medication and Seroquel prior to his fall. Daughter denies any recent fever. Patient was recently seen and admitted on 04/12/2015 for monitoring of 3 rib fractures. He was brought in on this date for evaluation of increased combativeness and decline in ADLs. Patient is currently staying at [facility]. He is DNR. Patient is not on blood thinners.  PCP - Dr. Clovis Riley  Patient is a 81 y.o. male presenting with fall and head injury. The history is provided by a relative (Daugher giving history). No language interpreter was used.  Fall  Head Injury   Past Medical History  Diagnosis Date  . Stroke   . Diabetes mellitus without complication   . CAD (coronary artery disease)   . Hypertension   . High cholesterol   . TIA (transient ischemic attack)    Past Surgical History  Procedure Laterality Date  . Carotid endarterectomy    . Coronary artery bypass graft  05/2012  . Lumbar disc surgery  1960  . Ankle surgery  1996  . Wrist surgery  1984   Family History  Problem Relation Age of Onset  . Cancer Sister    History    Substance Use Topics  . Smoking status: Former Smoker -- 1.00 packs/day    Types: Cigarettes  . Smokeless tobacco: Never Used  . Alcohol Use: No    Review of Systems  Unable to perform ROS: Dementia  Skin: Positive for wound.    Allergies  Ace inhibitors; Demerol; Morphine and related; Penicillins; and Tetanus toxoids  Home Medications   Prior to Admission medications   Medication Sig Start Date End Date Taking? Authorizing Provider  acetaminophen (TYLENOL) 325 MG tablet Take 650 mg by mouth Every 4 hours as needed   Yes Historical Provider, MD  alum & mag hydroxide-simeth (MAALOX/MYLANTA) 200-200-20 MG/5ML suspension Take 30 mLs by mouth every 6 (six) hours as needed for indigestion or heartburn.   Yes Historical Provider, MD  aspirin 81 MG tablet Take 81 mg by mouth daily.   Yes Historical Provider, MD  atorvastatin (LIPITOR) 40 MG tablet Take 40 mg by mouth daily.   Yes Historical Provider, MD  beta carotene w/minerals (OCUVITE) tablet Take 1 tablet by mouth 2 (two) times daily.    Yes Historical Provider, MD  CALCIUM-VITAMIN D PO Take 1 tablet by mouth daily.   Yes Historical Provider, MD  cetirizine (ZYRTEC) 10 MG tablet Take 10 mg by mouth daily.   Yes Historical Provider, MD  clonazePAM (KLONOPIN) 0.5 MG tablet Take 0.5 tablets (0.25 mg total) by mouth 3 (three) times daily as needed (anxiety). 04/17/15  Yes Starleen Arms, MD  docusate sodium (COLACE) 100 MG  capsule Take 50 mg by mouth 2 (two) times daily.    Yes Historical Provider, MD  feeding supplement, ENSURE ENLIVE, (ENSURE ENLIVE) LIQD Take 237 mLs by mouth 3 (three) times daily between meals. 04/17/15  Yes Starleen Armsawood S Elgergawy, MD  ferrous sulfate 325 (65 FE) MG tablet Take 325 mg by mouth daily with breakfast.   Yes Historical Provider, MD  insulin aspart (NOVOLOG) 100 UNIT/ML injection Inject 0-5 Units into the skin at bedtime. 04/17/15  Yes Starleen Armsawood S Elgergawy, MD  metoprolol succinate (TOPROL-XL) 50 MG 24 hr tablet  Take 50 mg by mouth daily. Take with or immediately following a meal.   Yes Historical Provider, MD  Multiple Vitamin (MULTIVITAMIN WITH MINERALS) TABS Take 1 tablet by mouth daily.   Yes Historical Provider, MD  nitroGLYCERIN (NITROSTAT) 0.4 MG SL tablet Place 0.4 mg under the tongue every 5 (five) minutes as needed. Chest pain   Yes Historical Provider, MD  QUEtiapine (SEROQUEL) 25 MG tablet Take 1 tablet (25 mg total) by mouth 3 (three) times daily. Patient taking differently: Take 25-50 mg by mouth 3 (three) times daily. Take 25 mg twice daily  Take 50 mg at bedtime 04/17/15  Yes Starleen Armsawood S Elgergawy, MD  traMADol (ULTRAM) 50 MG tablet Take 1 tablet (50 mg total) by mouth every 6 (six) hours as needed. Patient taking differently: Take 50 mg by mouth 2 (two) times daily.  04/06/15  Yes Marisa Severinlga Otter, MD   BP 131/60 mmHg  Pulse 88  Temp(Src) 98.5 F (36.9 C)  Resp 11  SpO2 87%   Physical Exam  Constitutional: He appears well-developed and well-nourished. No distress.  HENT:  Head: Normocephalic.    Hematoma to R temporal scalp. No active bleeding or skull instability. No battle's sign or raccoon's eyes.  Eyes: Conjunctivae and EOM are normal. No scleral icterus.  Neck:  Cervical collar in place  Cardiovascular: Normal rate, regular rhythm and intact distal pulses.   Pulmonary/Chest: Effort normal. No respiratory distress. He has no wheezes. He has no rales.  Lungs CTAB. Respirations even and unlabored.  Musculoskeletal: Normal range of motion.  Neurological:  Patient sleeping. No facial drooping noted.  Skin: Skin is warm and dry. No rash noted. He is not diaphoretic. No erythema. No pallor.  Skin tear to R 3rd digit, dorsal aspect  Psychiatric: He has a normal mood and affect. His behavior is normal.  Nursing note and vitals reviewed.   ED Course  Procedures (including critical care time) Labs Review Labs Reviewed  CBC - Abnormal; Notable for the following:    WBC 11.5 (*)     RBC 3.78 (*)    Hemoglobin 11.8 (*)    HCT 36.5 (*)    All other components within normal limits  I-STAT CHEM 8, ED - Abnormal; Notable for the following:    Glucose, Bld 191 (*)    Hemoglobin 12.6 (*)    HCT 37.0 (*)    All other components within normal limits  I-STAT TROPOININ, ED - Abnormal; Notable for the following:    Troponin i, poc 1.33 (*)    All other components within normal limits  URINE CULTURE  URINALYSIS, ROUTINE W REFLEX MICROSCOPIC (NOT AT [facility])  I-STAT ARTERIAL BLOOD GAS, ED    Imaging Review Ct Head Wo Contrast  04/28/2015   CLINICAL DATA:  Status post fall out of wheelchair to floor, and hit head. Right-sided scalp hematoma. Concern for cervical spine injury. Initial encounter.  EXAM: CT HEAD WITHOUT  CONTRAST  CT CERVICAL SPINE WITHOUT CONTRAST  TECHNIQUE: Multidetector CT imaging of the head and cervical spine was performed following the standard protocol without intravenous contrast. Multiplanar CT image reconstructions of the cervical spine were also generated.  COMPARISON:  CT of the head performed 04/12/2015, and MRI/MRA of the brain from 09/13/2012  FINDINGS: CT HEAD FINDINGS  There is no evidence of acute infarction, mass lesion, or intra- or extra-axial hemorrhage on CT.  Prominence of the ventricles and sulci reflects moderate cortical volume loss. A chronic infarct is noted at the left occipital lobe, with associated encephalomalacia. Scattered periventricular and subcortical white matter change likely reflects small vessel ischemic microangiopathy. A chronic lacunar infarct is noted at the left thalamus. Mild cerebellar atrophy is noted.  The brainstem and fourth ventricle are within normal limits. No mass effect or midline shift is seen.  There is no evidence of fracture; visualized osseous structures are unremarkable in appearance. The orbits are within normal limits. The paranasal sinuses and mastoid air cells are well-aerated. Mild soft tissue swelling is noted  overlying the right frontal calvarium.  CT CERVICAL SPINE FINDINGS  There is no evidence of acute fracture or subluxation. There is grade 1 retrolisthesis of C2 on C3, and grade 1 anterolisthesis of C4 on C5. There is grade 1 retrolisthesis of C5 on C6, and minimal grade 1 anterolisthesis of C6 on C7. Underlying facet disease is noted. Multilevel disc space narrowing and endplate irregularity are seen along the cervical spine. Vertebral bodies demonstrate normal height. Prevertebral soft tissues are within normal limits.  Minimal calcification is noted at the right thyroid lobe, without a dominant mass. Scattered peripheral blebs are seen at the lung apices. Scattered calcification is seen at the carotid bifurcations bilaterally.  IMPRESSION: 1. No evidence of traumatic intracranial injury or fracture. 2. No evidence of acute fracture or subluxation along the cervical spine. 3. Mild soft tissue swelling noted overlying the right frontal calvarium. 4. Moderate cortical volume loss and scattered small vessel ischemic microangiopathy noted. 5. Chronic infarct at the left occipital lobe, with associated encephalomalacia. Chronic lacunar infarct at the left thalamus. 6. Mild diffuse degenerative change along the cervical spine. 7. Scattered peripheral blebs noted at the lung apices. 8. Scattered calcification at the carotid bifurcations bilaterally. Carotid ultrasound could be considered for further evaluation, when and as deemed clinically appropriate.   Electronically Signed   By: Roanna Raider M.D.   On: 04/28/2015 23:28   Ct Cervical Spine Wo Contrast  04/28/2015   CLINICAL DATA:  Status post fall out of wheelchair to floor, and hit head. Right-sided scalp hematoma. Concern for cervical spine injury. Initial encounter.  EXAM: CT HEAD WITHOUT CONTRAST  CT CERVICAL SPINE WITHOUT CONTRAST  TECHNIQUE: Multidetector CT imaging of the head and cervical spine was performed following the standard protocol without  intravenous contrast. Multiplanar CT image reconstructions of the cervical spine were also generated.  COMPARISON:  CT of the head performed 04/12/2015, and MRI/MRA of the brain from 09/13/2012  FINDINGS: CT HEAD FINDINGS  There is no evidence of acute infarction, mass lesion, or intra- or extra-axial hemorrhage on CT.  Prominence of the ventricles and sulci reflects moderate cortical volume loss. A chronic infarct is noted at the left occipital lobe, with associated encephalomalacia. Scattered periventricular and subcortical white matter change likely reflects small vessel ischemic microangiopathy. A chronic lacunar infarct is noted at the left thalamus. Mild cerebellar atrophy is noted.  The brainstem and fourth ventricle are within normal limits.  No mass effect or midline shift is seen.  There is no evidence of fracture; visualized osseous structures are unremarkable in appearance. The orbits are within normal limits. The paranasal sinuses and mastoid air cells are well-aerated. Mild soft tissue swelling is noted overlying the right frontal calvarium.  CT CERVICAL SPINE FINDINGS  There is no evidence of acute fracture or subluxation. There is grade 1 retrolisthesis of C2 on C3, and grade 1 anterolisthesis of C4 on C5. There is grade 1 retrolisthesis of C5 on C6, and minimal grade 1 anterolisthesis of C6 on C7. Underlying facet disease is noted. Multilevel disc space narrowing and endplate irregularity are seen along the cervical spine. Vertebral bodies demonstrate normal height. Prevertebral soft tissues are within normal limits.  Minimal calcification is noted at the right thyroid lobe, without a dominant mass. Scattered peripheral blebs are seen at the lung apices. Scattered calcification is seen at the carotid bifurcations bilaterally.  IMPRESSION: 1. No evidence of traumatic intracranial injury or fracture. 2. No evidence of acute fracture or subluxation along the cervical spine. 3. Mild soft tissue swelling  noted overlying the right frontal calvarium. 4. Moderate cortical volume loss and scattered small vessel ischemic microangiopathy noted. 5. Chronic infarct at the left occipital lobe, with associated encephalomalacia. Chronic lacunar infarct at the left thalamus. 6. Mild diffuse degenerative change along the cervical spine. 7. Scattered peripheral blebs noted at the lung apices. 8. Scattered calcification at the carotid bifurcations bilaterally. Carotid ultrasound could be considered for further evaluation, when and as deemed clinically appropriate.   Electronically Signed   By: Roanna Raider M.D.   On: 04/28/2015 23:28   Dg Chest Port 1 View  04/28/2015   CLINICAL DATA:  Fall with head injury.  Altered mental status  EXAM: PORTABLE CHEST - 1 VIEW  COMPARISON:  04/12/2015  FINDINGS: No cardiomegaly. There is a chronic lower mediastinal mass which reflects large hiatal hernia based on previously seen fluid level 12/22/2014. There is no edema, consolidation, effusion, or pneumothorax. Remote median sternotomy correlating with history of CABG. Changes correlating with history of right carotid endarterectomy.  Remote left rib fractures. Remote left humeral diaphysis fracture with failed/fractured rods and overriding. No acute osseous findings.  Cholelithiasis.  IMPRESSION: 1. No active disease. 2. Multiple incidental findings are stable from prior and noted above.   Electronically Signed   By: Marnee Spring M.D.   On: 04/28/2015 23:44     EKG Interpretation   Date/Time:  Friday Apr 28 2015 21:43:21 EDT Ventricular Rate:  90 PR Interval:  169 QRS Duration: 93 QT Interval:  404 QTC Calculation: 494 R Axis:   47 Text Interpretation:  Age not entered, assumed to be  81 years old for  purpose of ECG interpretation Sinus rhythm Atrial premature complexes  Minimal ST depression, anterolateral leads Borderline prolonged QT  interval Confirmed by Lincoln Brigham [phone]) on 04/28/2015 10:31:54 PM      MDM    Final diagnoses:  Altered mental status, unspecified altered mental status type  Elevated troponin  Scalp hematoma, initial encounter  Skin tear of hand without complication, right, initial encounter    81 year old male with a history of dementia presents to the emergency department after a witnessed fall. Patient hit his head on the floor after he fell forward out of his wheelchair. CT head and cervical spine negative for acute changes. Patient noted to have a hematoma to his right scalp. EKG today showed minimal ST depression in anterior lateral leads. For  this reason, troponin was ordered which was found to be 1.33. Urinalysis pending.  Daughter reports worsening dementia over the last 2-3 weeks. Given elevated troponin and EKG changes, will admit for further evaluation. Case also discussed with cardiology fellow in the ED, who recommends [facility] admission. Dr. Toniann Fail to admit.   Filed Vitals:   04/28/15 2215 04/28/15 2315 04/28/15 2330 04/28/15 2345  BP: 126/66 131/65 108/58 131/60  Pulse: 93 87 86 88  Temp:      Resp: 14 12 18 11   SpO2: 91% 89% 88% 87%       Antony Madura, PA-C 04/29/15 0017  Tilden Fossa, MD 04/29/15 [phone]

## 2015-04-28 NOTE — ED Notes (Signed)
In and out cath completed by this RN

## 2015-04-29 ENCOUNTER — Inpatient Hospital Stay (HOSPITAL_COMMUNITY): Payer: Medicare PPO

## 2015-04-29 ENCOUNTER — Encounter (HOSPITAL_COMMUNITY): Payer: Self-pay | Admitting: Internal Medicine

## 2015-04-29 DIAGNOSIS — E780 Pure hypercholesterolemia: Secondary | ICD-10-CM | POA: Diagnosis present

## 2015-04-29 DIAGNOSIS — R7989 Other specified abnormal findings of blood chemistry: Secondary | ICD-10-CM | POA: Diagnosis not present

## 2015-04-29 DIAGNOSIS — Z8673 Personal history of transient ischemic attack (TIA), and cerebral infarction without residual deficits: Secondary | ICD-10-CM | POA: Diagnosis not present

## 2015-04-29 DIAGNOSIS — S0003XA Contusion of scalp, initial encounter: Secondary | ICD-10-CM | POA: Diagnosis present

## 2015-04-29 DIAGNOSIS — R4182 Altered mental status, unspecified: Secondary | ICD-10-CM | POA: Diagnosis present

## 2015-04-29 DIAGNOSIS — F0390 Unspecified dementia without behavioral disturbance: Secondary | ICD-10-CM | POA: Diagnosis present

## 2015-04-29 DIAGNOSIS — G9340 Encephalopathy, unspecified: Secondary | ICD-10-CM | POA: Diagnosis present

## 2015-04-29 DIAGNOSIS — Z880 Allergy status to penicillin: Secondary | ICD-10-CM | POA: Diagnosis not present

## 2015-04-29 DIAGNOSIS — Z887 Allergy status to serum and vaccine status: Secondary | ICD-10-CM | POA: Diagnosis not present

## 2015-04-29 DIAGNOSIS — Z515 Encounter for palliative care: Secondary | ICD-10-CM | POA: Insufficient documentation

## 2015-04-29 DIAGNOSIS — Z66 Do not resuscitate: Secondary | ICD-10-CM | POA: Diagnosis present

## 2015-04-29 DIAGNOSIS — Z885 Allergy status to narcotic agent status: Secondary | ICD-10-CM | POA: Diagnosis not present

## 2015-04-29 DIAGNOSIS — Z7982 Long term (current) use of aspirin: Secondary | ICD-10-CM | POA: Diagnosis not present

## 2015-04-29 DIAGNOSIS — Z87891 Personal history of nicotine dependence: Secondary | ICD-10-CM | POA: Diagnosis not present

## 2015-04-29 DIAGNOSIS — Z951 Presence of aortocoronary bypass graft: Secondary | ICD-10-CM | POA: Diagnosis not present

## 2015-04-29 DIAGNOSIS — I10 Essential (primary) hypertension: Secondary | ICD-10-CM | POA: Diagnosis present

## 2015-04-29 DIAGNOSIS — W19XXXA Unspecified fall, initial encounter: Secondary | ICD-10-CM | POA: Diagnosis present

## 2015-04-29 DIAGNOSIS — I2510 Atherosclerotic heart disease of native coronary artery without angina pectoris: Secondary | ICD-10-CM | POA: Diagnosis present

## 2015-04-29 DIAGNOSIS — E785 Hyperlipidemia, unspecified: Secondary | ICD-10-CM | POA: Diagnosis present

## 2015-04-29 DIAGNOSIS — R778 Other specified abnormalities of plasma proteins: Secondary | ICD-10-CM

## 2015-04-29 DIAGNOSIS — F0391 Unspecified dementia with behavioral disturbance: Secondary | ICD-10-CM | POA: Diagnosis present

## 2015-04-29 DIAGNOSIS — R627 Adult failure to thrive: Secondary | ICD-10-CM | POA: Diagnosis present

## 2015-04-29 DIAGNOSIS — E119 Type 2 diabetes mellitus without complications: Secondary | ICD-10-CM | POA: Diagnosis present

## 2015-04-29 LAB — URINALYSIS, ROUTINE W REFLEX MICROSCOPIC
Bilirubin Urine: NEGATIVE
Glucose, UA: NEGATIVE mg/dL
HGB URINE DIPSTICK: NEGATIVE
Ketones, ur: NEGATIVE mg/dL
Leukocytes, UA: NEGATIVE
NITRITE: NEGATIVE
PH: 7 (ref 5.0–8.0)
PROTEIN: 30 mg/dL — AB
SPECIFIC GRAVITY, URINE: 1.021 (ref 1.005–1.030)
UROBILINOGEN UA: 0.2 mg/dL (ref 0.0–1.0)

## 2015-04-29 LAB — GLUCOSE, CAPILLARY
Glucose-Capillary: 147 mg/dL — ABNORMAL HIGH (ref 65–99)
Glucose-Capillary: 103 mg/dL — ABNORMAL HIGH (ref 65–99)

## 2015-04-29 LAB — I-STAT ARTERIAL BLOOD GAS, ED
Acid-Base Excess: 2 mmol/L (ref 0.0–2.0)
BICARBONATE: 27.2 meq/L — AB (ref 20.0–24.0)
O2 Saturation: 96 %
TCO2: 28 mmol/L (ref 0–100)
pCO2 arterial: 42.8 mmHg (ref 35.0–45.0)
pH, Arterial: 7.411 (ref 7.350–7.450)
pO2, Arterial: 83 mmHg (ref 80.0–100.0)

## 2015-04-29 LAB — URINE MICROSCOPIC-ADD ON

## 2015-04-29 MED ORDER — HALOPERIDOL LACTATE 5 MG/ML IJ SOLN
2.0000 mg | Freq: Four times a day (QID) | INTRAMUSCULAR | Status: DC | PRN
  Filled 2015-04-29: qty 1

## 2015-04-29 MED ORDER — HALOPERIDOL LACTATE 5 MG/ML IJ SOLN
2.0000 mg | Freq: Once | INTRAMUSCULAR | Status: AC
  Administered 2015-04-29: 2 mg via INTRAVENOUS
  Filled 2015-04-29: qty 1

## 2015-04-29 MED ORDER — QUETIAPINE FUMARATE 25 MG PO TABS
25.0000 mg | ORAL_TABLET | Freq: Three times a day (TID) | ORAL | Status: DC
Start: 2015-04-29 — End: 2015-04-29
  Filled 2015-04-29: qty 1

## 2015-04-29 MED ORDER — TRAMADOL HCL 50 MG PO TABS
50.0000 mg | ORAL_TABLET | Freq: Two times a day (BID) | ORAL | Status: DC
  Administered 2015-04-29 – 2015-05-01 (×3): 50 mg via ORAL
  Filled 2015-04-29 (×4): qty 1

## 2015-04-29 MED ORDER — LORATADINE 10 MG PO TABS
10.0000 mg | ORAL_TABLET | Freq: Every day | ORAL | Status: DC
  Filled 2015-04-29: qty 1

## 2015-04-29 MED ORDER — DOCUSATE SODIUM 50 MG PO CAPS
50.0000 mg | ORAL_CAPSULE | Freq: Two times a day (BID) | ORAL | Status: DC
  Administered 2015-04-29: 50 mg via ORAL
  Filled 2015-04-29 (×4): qty 1

## 2015-04-29 MED ORDER — ONDANSETRON HCL 4 MG/2ML IJ SOLN: 4.0000 mg | Freq: Four times a day (QID) | INTRAMUSCULAR | Status: DC | PRN

## 2015-04-29 MED ORDER — ACETAMINOPHEN 650 MG RE SUPP: 650.0000 mg | Freq: Four times a day (QID) | RECTAL | Status: DC | PRN

## 2015-04-29 MED ORDER — QUETIAPINE FUMARATE 25 MG PO TABS
25.0000 mg | ORAL_TABLET | Freq: Three times a day (TID) | ORAL | Status: DC
  Administered 2015-04-29 – 2015-05-01 (×4): 25 mg via ORAL
  Filled 2015-04-29 (×5): qty 1

## 2015-04-29 MED ORDER — ATORVASTATIN CALCIUM 40 MG PO TABS
40.0000 mg | ORAL_TABLET | Freq: Every day | ORAL | Status: DC
  Filled 2015-04-29: qty 1

## 2015-04-29 MED ORDER — ENSURE ENLIVE PO LIQD
237.0000 mL | Freq: Three times a day (TID) | ORAL | Status: DC
  Administered 2015-04-29: 237 mL via ORAL

## 2015-04-29 MED ORDER — LORAZEPAM 2 MG/ML IJ SOLN
0.5000 mg | INTRAMUSCULAR | Status: DC | PRN
  Administered 2015-04-30 – 2015-05-01 (×3): 0.5 mg via INTRAVENOUS
  Filled 2015-04-29 (×3): qty 1

## 2015-04-29 MED ORDER — FERROUS SULFATE 325 (65 FE) MG PO TABS
325.0000 mg | ORAL_TABLET | Freq: Every day | ORAL | Status: DC
  Administered 2015-04-29: 325 mg via ORAL
  Filled 2015-04-29: qty 1

## 2015-04-29 MED ORDER — SODIUM CHLORIDE 0.9 % IV SOLN
INTRAVENOUS | Status: DC
  Administered 2015-04-29: 04:00:00 via INTRAVENOUS

## 2015-04-29 MED ORDER — ACETAMINOPHEN 325 MG PO TABS: 650.0000 mg | ORAL_TABLET | Freq: Four times a day (QID) | ORAL | Status: DC | PRN

## 2015-04-29 MED ORDER — HYDROMORPHONE HCL 1 MG/ML IJ SOLN
0.5000 mg | INTRAMUSCULAR | Status: DC | PRN
  Administered 2015-04-30 – 2015-05-01 (×3): 0.5 mg via INTRAVENOUS
  Filled 2015-04-29 (×3): qty 1

## 2015-04-29 MED ORDER — CLONAZEPAM 0.5 MG PO TABS
0.2500 mg | ORAL_TABLET | Freq: Three times a day (TID) | ORAL | Status: DC | PRN
  Administered 2015-04-29: 0.25 mg via ORAL
  Filled 2015-04-29: qty 1

## 2015-04-29 MED ORDER — HALOPERIDOL LACTATE 5 MG/ML IJ SOLN
2.0000 mg | INTRAMUSCULAR | Status: DC | PRN
  Administered 2015-04-29 – 2015-04-30 (×2): 2 mg via INTRAVENOUS
  Administered 2015-04-30 (×2): 4 mg via INTRAVENOUS
  Filled 2015-04-29 (×4): qty 1

## 2015-04-29 MED ORDER — ONDANSETRON HCL 4 MG PO TABS: 4.0000 mg | ORAL_TABLET | Freq: Four times a day (QID) | ORAL | Status: DC | PRN

## 2015-04-29 MED ORDER — ASPIRIN EC 81 MG PO TBEC
81.0000 mg | DELAYED_RELEASE_TABLET | Freq: Every day | ORAL | Status: DC
  Filled 2015-04-29 (×2): qty 1

## 2015-04-29 MED ORDER — ATROPINE SULFATE 1 % OP SOLN
2.0000 [drp] | OPHTHALMIC | Status: DC | PRN
  Filled 2015-04-29: qty 2

## 2015-04-29 MED ORDER — MORPHINE SULFATE 2 MG/ML IJ SOLN: 1.0000 mg | INTRAMUSCULAR | Status: DC | PRN

## 2015-04-29 MED ORDER — LORAZEPAM 0.5 MG PO TABS
0.5000 mg | ORAL_TABLET | Freq: Two times a day (BID) | ORAL | Status: DC
  Administered 2015-04-29 – 2015-05-01 (×4): 0.5 mg via ORAL
  Filled 2015-04-29 (×4): qty 1

## 2015-04-29 MED ORDER — HALOPERIDOL LACTATE 5 MG/ML IJ SOLN
1.0000 mg | Freq: Four times a day (QID) | INTRAMUSCULAR | Status: DC | PRN
  Administered 2015-04-29 (×2): 1 mg via INTRAVENOUS
  Filled 2015-04-29 (×2): qty 1

## 2015-04-29 MED ORDER — METOPROLOL SUCCINATE ER 25 MG PO TB24
50.0000 mg | ORAL_TABLET | Freq: Every day | ORAL | Status: DC
  Filled 2015-04-29: qty 2

## 2015-04-29 MED ORDER — INSULIN ASPART 100 UNIT/ML ~~LOC~~ SOLN
0.0000 [IU] | Freq: Three times a day (TID) | SUBCUTANEOUS | Status: DC
  Administered 2015-04-29: 1 [IU] via SUBCUTANEOUS

## 2015-04-29 MED ORDER — NITROGLYCERIN 0.4 MG SL SUBL
0.4000 mg | SUBLINGUAL_TABLET | SUBLINGUAL | Status: DC | PRN
Start: 2015-04-29 — End: 2015-04-29

## 2015-04-29 NOTE — ED Notes (Signed)
Pt transported to MRI 

## 2015-04-29 NOTE — H&P (Signed)
[facility] History and Physical  ASEEM SESSUMS ZOX:[id number] DOB: [birth year] DOA: 04/28/2015  Referring physician: Dr.Rees. PCP: Lupe Carney, MD  Specialists: None.  Chief Complaint: Fall and worsening mental status.  History obtained from patient's daughter.  HPI: Douglas May is a 81 y.o. male with history of CAD status post CABG, hypertension, diabetes mellitus type 2, previous history of CVA who was recently admitted to the hospital secondary to acute renal failure and rib fractures and was eventually discharged to a rehabilitation was brought to the ER the patient had a fall at the rehabilitation. As per patient's daughter patient was on a wheelchair and was trying to reach to his foot when he slid out of the wheelchair and hit his head. Patient did not lose consciousness. In the ER CT head and C-spine was unremarkable. Patient's daughter was concerned about patient's rapid decline in mental status for which MRI was done which did not show anything acute. Patient has been admitted for further management. On my exam patient is presently sedated after receiving patient's my dose of Seroquel. As per patient daughter patient was recently placed on Seroquel during last discharge and patient's Seroquel dose was recently increased for the nighttime from 25 mg to 50 mg. When patient takes Seroquel he gets less agitated but when the time wears off he gets agitated. Patient has been increasingly getting more confused. Patient did not have any nausea vomiting abdominal pain or diarrhea.   Review of Systems: As presented in the history of presenting illness, rest negative.  Past Medical History  Diagnosis Date  . Stroke   . Diabetes mellitus without complication   . CAD (coronary artery disease)   . Hypertension   . High cholesterol   . TIA (transient ischemic attack)    Past Surgical History  Procedure Laterality Date  . Carotid endarterectomy    . Coronary artery bypass  graft  05/2012  . Lumbar disc surgery  1960  . Ankle surgery  1996  . Wrist surgery  1984   Social History:  reports that he has quit smoking. His smoking use included Cigarettes. He smoked 1.00 pack per day. He has never used smokeless tobacco. He reports that he does not drink alcohol or use illicit drugs. Where does patient live rehabilitation. Can patient participate in ADLs? No.  Allergies  Allergen Reactions  . Ace Inhibitors Other (See Comments)  . Demerol [Meperidine] Other (See Comments)  . Morphine And Related Itching  . Penicillins Other (See Comments)    unknown  . Tetanus Toxoids Other (See Comments)    Family History:  Family History  Problem Relation Age of Onset  . Cancer Sister       Prior to Admission medications   Medication Sig Start Date End Date Taking? Authorizing Provider  acetaminophen (TYLENOL) 325 MG tablet Take 650 mg by mouth Every 4 hours as needed   Yes Historical Provider, MD  alum & mag hydroxide-simeth (MAALOX/MYLANTA) 200-200-20 MG/5ML suspension Take 30 mLs by mouth every 6 (six) hours as needed for indigestion or heartburn.   Yes Historical Provider, MD  aspirin 81 MG tablet Take 81 mg by mouth daily.   Yes Historical Provider, MD  atorvastatin (LIPITOR) 40 MG tablet Take 40 mg by mouth daily.   Yes Historical Provider, MD  beta carotene w/minerals (OCUVITE) tablet Take 1 tablet by mouth 2 (two) times daily.    Yes Historical Provider, MD  CALCIUM-VITAMIN D PO Take 1 tablet by mouth daily.  Yes Historical Provider, MD  cetirizine (ZYRTEC) 10 MG tablet Take 10 mg by mouth daily.   Yes Historical Provider, MD  clonazePAM (KLONOPIN) 0.5 MG tablet Take 0.5 tablets (0.25 mg total) by mouth 3 (three) times daily as needed (anxiety). 04/17/15  Yes Starleen Arms, MD  docusate sodium (COLACE) 100 MG capsule Take 50 mg by mouth 2 (two) times daily.    Yes Historical Provider, MD  feeding supplement, ENSURE ENLIVE, (ENSURE ENLIVE) LIQD Take 237 mLs by  mouth 3 (three) times daily between meals. 04/17/15  Yes Starleen Arms, MD  ferrous sulfate 325 (65 FE) MG tablet Take 325 mg by mouth daily with breakfast.   Yes Historical Provider, MD  insulin aspart (NOVOLOG) 100 UNIT/ML injection Inject 0-5 Units into the skin at bedtime. 04/17/15  Yes Starleen Arms, MD  metoprolol succinate (TOPROL-XL) 50 MG 24 hr tablet Take 50 mg by mouth daily. Take with or immediately following a meal.   Yes Historical Provider, MD  Multiple Vitamin (MULTIVITAMIN WITH MINERALS) TABS Take 1 tablet by mouth daily.   Yes Historical Provider, MD  nitroGLYCERIN (NITROSTAT) 0.4 MG SL tablet Place 0.4 mg under the tongue every 5 (five) minutes as needed. Chest pain   Yes Historical Provider, MD  QUEtiapine (SEROQUEL) 25 MG tablet Take 1 tablet (25 mg total) by mouth 3 (three) times daily. Patient taking differently: Take 25-50 mg by mouth 3 (three) times daily. Take 25 mg twice daily  Take 50 mg at bedtime 04/17/15  Yes Starleen Arms, MD  traMADol (ULTRAM) 50 MG tablet Take 1 tablet (50 mg total) by mouth every 6 (six) hours as needed. Patient taking differently: Take 50 mg by mouth 2 (two) times daily.  04/06/15  Yes Marisa Severin, MD    Physical Exam: Filed Vitals:   04/28/15 2315 04/28/15 2330 04/28/15 2345 04/29/15 0127  BP: 131/65 108/58 131/60 104/51  Pulse: 87 86 88 82  Temp:    97.5 F (36.4 C)  TempSrc:    Axillary  Resp: 12 18 11 10   Height:    5\' 2"  (1.575 m)  Weight:    63.594 kg (140 lb 3.2 oz)  SpO2: 89% 88% 87% 100%     General:  Moderately built and nourished.  Eyes: Anicteric no pallor.  ENT: Right forehead scalp hematoma.  Neck: No neck rigidity.  Cardiovascular: S1-S2 heard.  Respiratory: No rhonchi or crepitations.  Abdomen: Soft nontender bowel sounds present.  Skin: Skin tear on the right frontal head.  Musculoskeletal: No edema.  Psychiatric: Patient is sleeping.  Neurologic: Patient is presently sedated.  Labs on  Admission:  Basic Metabolic Panel:  Recent Labs Lab 04/28/15 2204  NA 143  K 3.8  CL 105  GLUCOSE 191*  BUN 18  CREATININE 1.20   Liver Function Tests: No results for input(s): AST, ALT, ALKPHOS, BILITOT, PROT, ALBUMIN in the last 168 hours. No results for input(s): LIPASE, AMYLASE in the last 168 hours. No results for input(s): AMMONIA in the last 168 hours. CBC:  Recent Labs Lab 04/28/15 2157 04/28/15 2204  WBC 11.5*  --   HGB 11.8* 12.6*  HCT 36.5* 37.0*  MCV 96.6  --   PLT 314  --    Cardiac Enzymes: No results for input(s): CKTOTAL, CKMB, CKMBINDEX, TROPONINI in the last 168 hours.  BNP (last 3 results) No results for input(s): BNP in the last 8760 hours.  ProBNP (last 3 results) No results for input(s): PROBNP in  the last 8760 hours.  CBG: No results for input(s): GLUCAP in the last 168 hours.  Radiological Exams on Admission: Ct Head Wo Contrast  04/28/2015   CLINICAL DATA:  Status post fall out of wheelchair to floor, and hit head. Right-sided scalp hematoma. Concern for cervical spine injury. Initial encounter.  EXAM: CT HEAD WITHOUT CONTRAST  CT CERVICAL SPINE WITHOUT CONTRAST  TECHNIQUE: Multidetector CT imaging of the head and cervical spine was performed following the standard protocol without intravenous contrast. Multiplanar CT image reconstructions of the cervical spine were also generated.  COMPARISON:  CT of the head performed 04/12/2015, and MRI/MRA of the brain from 09/13/2012  FINDINGS: CT HEAD FINDINGS  There is no evidence of acute infarction, mass lesion, or intra- or extra-axial hemorrhage on CT.  Prominence of the ventricles and sulci reflects moderate cortical volume loss. A chronic infarct is noted at the left occipital lobe, with associated encephalomalacia. Scattered periventricular and subcortical white matter change likely reflects small vessel ischemic microangiopathy. A chronic lacunar infarct is noted at the left thalamus. Mild cerebellar  atrophy is noted.  The brainstem and fourth ventricle are within normal limits. No mass effect or midline shift is seen.  There is no evidence of fracture; visualized osseous structures are unremarkable in appearance. The orbits are within normal limits. The paranasal sinuses and mastoid air cells are well-aerated. Mild soft tissue swelling is noted overlying the right frontal calvarium.  CT CERVICAL SPINE FINDINGS  There is no evidence of acute fracture or subluxation. There is grade 1 retrolisthesis of C2 on C3, and grade 1 anterolisthesis of C4 on C5. There is grade 1 retrolisthesis of C5 on C6, and minimal grade 1 anterolisthesis of C6 on C7. Underlying facet disease is noted. Multilevel disc space narrowing and endplate irregularity are seen along the cervical spine. Vertebral bodies demonstrate normal height. Prevertebral soft tissues are within normal limits.  Minimal calcification is noted at the right thyroid lobe, without a dominant mass. Scattered peripheral blebs are seen at the lung apices. Scattered calcification is seen at the carotid bifurcations bilaterally.  IMPRESSION: 1. No evidence of traumatic intracranial injury or fracture. 2. No evidence of acute fracture or subluxation along the cervical spine. 3. Mild soft tissue swelling noted overlying the right frontal calvarium. 4. Moderate cortical volume loss and scattered small vessel ischemic microangiopathy noted. 5. Chronic infarct at the left occipital lobe, with associated encephalomalacia. Chronic lacunar infarct at the left thalamus. 6. Mild diffuse degenerative change along the cervical spine. 7. Scattered peripheral blebs noted at the lung apices. 8. Scattered calcification at the carotid bifurcations bilaterally. Carotid ultrasound could be considered for further evaluation, when and as deemed clinically appropriate.   Electronically Signed   By: Roanna RaiderJeffery  Chang M.D.   On: 04/28/2015 23:28   Ct Cervical Spine Wo Contrast  04/28/2015    CLINICAL DATA:  Status post fall out of wheelchair to floor, and hit head. Right-sided scalp hematoma. Concern for cervical spine injury. Initial encounter.  EXAM: CT HEAD WITHOUT CONTRAST  CT CERVICAL SPINE WITHOUT CONTRAST  TECHNIQUE: Multidetector CT imaging of the head and cervical spine was performed following the standard protocol without intravenous contrast. Multiplanar CT image reconstructions of the cervical spine were also generated.  COMPARISON:  CT of the head performed 04/12/2015, and MRI/MRA of the brain from 09/13/2012  FINDINGS: CT HEAD FINDINGS  There is no evidence of acute infarction, mass lesion, or intra- or extra-axial hemorrhage on CT.  Prominence of the ventricles  and sulci reflects moderate cortical volume loss. A chronic infarct is noted at the left occipital lobe, with associated encephalomalacia. Scattered periventricular and subcortical white matter change likely reflects small vessel ischemic microangiopathy. A chronic lacunar infarct is noted at the left thalamus. Mild cerebellar atrophy is noted.  The brainstem and fourth ventricle are within normal limits. No mass effect or midline shift is seen.  There is no evidence of fracture; visualized osseous structures are unremarkable in appearance. The orbits are within normal limits. The paranasal sinuses and mastoid air cells are well-aerated. Mild soft tissue swelling is noted overlying the right frontal calvarium.  CT CERVICAL SPINE FINDINGS  There is no evidence of acute fracture or subluxation. There is grade 1 retrolisthesis of C2 on C3, and grade 1 anterolisthesis of C4 on C5. There is grade 1 retrolisthesis of C5 on C6, and minimal grade 1 anterolisthesis of C6 on C7. Underlying facet disease is noted. Multilevel disc space narrowing and endplate irregularity are seen along the cervical spine. Vertebral bodies demonstrate normal height. Prevertebral soft tissues are within normal limits.  Minimal calcification is noted at the  right thyroid lobe, without a dominant mass. Scattered peripheral blebs are seen at the lung apices. Scattered calcification is seen at the carotid bifurcations bilaterally.  IMPRESSION: 1. No evidence of traumatic intracranial injury or fracture. 2. No evidence of acute fracture or subluxation along the cervical spine. 3. Mild soft tissue swelling noted overlying the right frontal calvarium. 4. Moderate cortical volume loss and scattered small vessel ischemic microangiopathy noted. 5. Chronic infarct at the left occipital lobe, with associated encephalomalacia. Chronic lacunar infarct at the left thalamus. 6. Mild diffuse degenerative change along the cervical spine. 7. Scattered peripheral blebs noted at the lung apices. 8. Scattered calcification at the carotid bifurcations bilaterally. Carotid ultrasound could be considered for further evaluation, when and as deemed clinically appropriate.   Electronically Signed   By: Roanna Raider M.D.   On: 04/28/2015 23:28   Mr Brain Wo Contrast  04/29/2015   CLINICAL DATA:  Initial evaluation for acute fall, head injury.  EXAM: MRI HEAD WITHOUT CONTRAST  TECHNIQUE: Multiplanar, multiecho pulse sequences of the brain and surrounding structures were obtained without intravenous contrast.  COMPARISON:  Prior CT from 04/28/2015  FINDINGS: Diffuse prominence of the CSF containing spaces is compatible with generalized cerebral atrophy. Patchy and confluent T2/FLAIR hyperintensity involving the periventricular deep white matter both cerebral hemispheres most consistent with chronic small vessel ischemic changes, fairly advanced. Encephalomalacia within the left occipital lobe compatible with remote infarct. Additional scattered remote lacunar infarcts present within the bilateral basal ganglia.  No abnormal foci of restricted diffusion to suggest acute intracranial infarct. Gray-white matter differentiation maintained. Normal intravascular flow voids preserved. No acute  intracranial hemorrhage. Chronic blood products seen associated with remote left occipital infarct.  No mass lesion or midline shift. No mass effect. No hydrocephalus. No extra-axial fluid collection.  Craniocervical junction within normal limits. Degenerative changes noted within the upper cervical spine.  Pituitary gland within normal limits. No acute abnormality about the orbits. Paranasal sinuses and mastoid air cells are clear.  Small right forehead scalp contusion. Bone marrow signal intensity normal.  IMPRESSION: 1. No acute intracranial infarct or other abnormality identified. 2. Small right frontal scalp contusion. 3. Remote left occipital lobe infarct with additional remote lacunar infarcts involving the bilateral basal ganglia. 4. Advanced cerebral atrophy with chronic microvascular ischemic disease.   Electronically Signed   By: Janell Quiet.D.  On: 04/29/2015 01:38   Dg Chest Port 1 View  04/28/2015   CLINICAL DATA:  Fall with head injury.  Altered mental status  EXAM: PORTABLE CHEST - 1 VIEW  COMPARISON:  04/12/2015  FINDINGS: No cardiomegaly. There is a chronic lower mediastinal mass which reflects large hiatal hernia based on previously seen fluid level 12/22/2014. There is no edema, consolidation, effusion, or pneumothorax. Remote median sternotomy correlating with history of CABG. Changes correlating with history of right carotid endarterectomy.  Remote left rib fractures. Remote left humeral diaphysis fracture with failed/fractured rods and overriding. No acute osseous findings.  Cholelithiasis.  IMPRESSION: 1. No active disease. 2. Multiple incidental findings are stable from prior and noted above.   Electronically Signed   By: Marnee Spring M.D.   On: 04/28/2015 23:44    EKG: Independently reviewed. Normal sinus rhythm.  Assessment/Plan Active Problems:   Diabetes mellitus type 2, controlled   Dementia with behavioral disturbance   Elevated troponin   Scalp hematoma    Essential hypertension   Dementia   1. Dementia with behavioral disturbance. 2. Fall with right frontal scalp hematoma. 3. Elevated troponin. 4. Hypertension. 5. Diabetes mellitus type 2. 6. Failure to thrive.  Plan - I had extensive discussion with patient's daughter was at the bedside. I have reviewed patient's old charts and recent labs and x-rays. At this time after extensive discussion plan is to keep patient comfortable and make patient less agitated. Patient's daughter states that patient did do well with when necessary IV Haldol at the last admission and also at the rehabilitation. Patient's daughter at this time is only wanting comfort measures so no further labs have been ordered but requests psychiatric consult for further adjustment of patient's medications. Please consult psychiatrist in a.m. I have also consult hospice care for further goals and comfort measures. Social work consult for placement in memory unit.   DVT Prophylaxis SCDs for now and if there is no further bleeding from the scalp make place patient on Lovenox.  Code Status: DO NOT RESUSCITATE.  Family Communication: Patient's daughter.  Disposition Plan: Admit to inpatient.    Natoshia Souter N. [facility] Pager [phone].  If 7PM-7AM, please contact night-coverage [email or web address] Password [email or web address] 04/29/2015, 3:47 AM

## 2015-04-29 NOTE — Progress Notes (Signed)
[facility] PROGRESS NOTE  Douglas May UJW:[id number] DOB: 01-Jun-1924 DOA: 04/28/2015 PCP: Lupe Carney, MD  Assessment/Plan: 81 y/o male with PMH of HTN, CAD, DM, h/o CVA, advanced dementia recently admitted to the hospital secondary to acute renal failure and rib fractures and was eventually discharged to a rehabilitation was brought to the ER the patient had a fall at the rehabilitation. Patient was progressively declined in health, confused. FTT  -ED: X ray/MRI: no acute findings. Admitting physician d/w family who requested comfort care   1. Advanced dementia, behavioral changes, encephalopathy, delirium. Recurrent CVA, falls, progressive decline FTT.  -prognosis is poor. Asked palliative care evaluation. Patient is DNR  2. Encephalopathy, del;irium with advanced dementia   -will start scheduled ativan 0.5 BID, cont Seroquel. Prn haldol.  Clonopin   Code Status: DNR Family Communication: d/w patient. No family is at the bedside  (indicate person spoken with, relationship, and if by phone, the number) Disposition Plan: pend palliative care    Consultants:  Palliative care   Procedures:  none  Antibiotics:  noen (indicate start date, and stop date if known)  HPI/Subjective: Sleepy   Objective: Filed Vitals:   04/29/15 0558  BP: 94/71  Pulse: 73  Temp: 97.8 F (36.6 C)  Resp: 16   No intake or output data in the 24 hours ending 04/29/15 1411 Filed Weights   04/29/15 0127  Weight: 63.594 kg (140 lb 3.2 oz)    Exam:   General:  Somnolent   Cardiovascular: s1,s2 rrr  Respiratory: CTA BL  Abdomen: soft, nt, nd   Musculoskeletal: no leg edema   Data Reviewed: Basic Metabolic Panel:  Recent Labs Lab 04/28/15 2204  NA 143  K 3.8  CL 105  GLUCOSE 191*  BUN 18  CREATININE 1.20   Liver Function Tests: No results for input(s): AST, ALT, ALKPHOS, BILITOT, PROT, ALBUMIN in the last 168 hours. No results for input(s): LIPASE, AMYLASE in  the last 168 hours. No results for input(s): AMMONIA in the last 168 hours. CBC:  Recent Labs Lab 04/28/15 2157 04/28/15 2204  WBC 11.5*  --   HGB 11.8* 12.6*  HCT 36.5* 37.0*  MCV 96.6  --   PLT 314  --    Cardiac Enzymes: No results for input(s): CKTOTAL, CKMB, CKMBINDEX, TROPONINI in the last 168 hours. BNP (last 3 results) No results for input(s): BNP in the last 8760 hours.  ProBNP (last 3 results) No results for input(s): PROBNP in the last 8760 hours.  CBG:  Recent Labs Lab 04/29/15 0737 04/29/15 1211  GLUCAP 147* 103*    No results found for this or any previous visit (from the past 240 hour(s)).   Studies: Ct Head Wo Contrast  04/28/2015   CLINICAL DATA:  Status post fall out of wheelchair to floor, and hit head. Right-sided scalp hematoma. Concern for cervical spine injury. Initial encounter.  EXAM: CT HEAD WITHOUT CONTRAST  CT CERVICAL SPINE WITHOUT CONTRAST  TECHNIQUE: Multidetector CT imaging of the head and cervical spine was performed following the standard protocol without intravenous contrast. Multiplanar CT image reconstructions of the cervical spine were also generated.  COMPARISON:  CT of the head performed 04/12/2015, and MRI/MRA of the brain from 09/13/2012  FINDINGS: CT HEAD FINDINGS  There is no evidence of acute infarction, mass lesion, or intra- or extra-axial hemorrhage on CT.  Prominence of the ventricles and sulci reflects moderate cortical volume loss. A chronic infarct is noted at the left occipital lobe, with associated  encephalomalacia. Scattered periventricular and subcortical white matter change likely reflects small vessel ischemic microangiopathy. A chronic lacunar infarct is noted at the left thalamus. Mild cerebellar atrophy is noted.  The brainstem and fourth ventricle are within normal limits. No mass effect or midline shift is seen.  There is no evidence of fracture; visualized osseous structures are unremarkable in appearance. The orbits  are within normal limits. The paranasal sinuses and mastoid air cells are well-aerated. Mild soft tissue swelling is noted overlying the right frontal calvarium.  CT CERVICAL SPINE FINDINGS  There is no evidence of acute fracture or subluxation. There is grade 1 retrolisthesis of C2 on C3, and grade 1 anterolisthesis of C4 on C5. There is grade 1 retrolisthesis of C5 on C6, and minimal grade 1 anterolisthesis of C6 on C7. Underlying facet disease is noted. Multilevel disc space narrowing and endplate irregularity are seen along the cervical spine. Vertebral bodies demonstrate normal height. Prevertebral soft tissues are within normal limits.  Minimal calcification is noted at the right thyroid lobe, without a dominant mass. Scattered peripheral blebs are seen at the lung apices. Scattered calcification is seen at the carotid bifurcations bilaterally.  IMPRESSION: 1. No evidence of traumatic intracranial injury or fracture. 2. No evidence of acute fracture or subluxation along the cervical spine. 3. Mild soft tissue swelling noted overlying the right frontal calvarium. 4. Moderate cortical volume loss and scattered small vessel ischemic microangiopathy noted. 5. Chronic infarct at the left occipital lobe, with associated encephalomalacia. Chronic lacunar infarct at the left thalamus. 6. Mild diffuse degenerative change along the cervical spine. 7. Scattered peripheral blebs noted at the lung apices. 8. Scattered calcification at the carotid bifurcations bilaterally. Carotid ultrasound could be considered for further evaluation, when and as deemed clinically appropriate.   Electronically Signed   By: Roanna Raider M.D.   On: 04/28/2015 23:28   Ct Cervical Spine Wo Contrast  04/28/2015   CLINICAL DATA:  Status post fall out of wheelchair to floor, and hit head. Right-sided scalp hematoma. Concern for cervical spine injury. Initial encounter.  EXAM: CT HEAD WITHOUT CONTRAST  CT CERVICAL SPINE WITHOUT CONTRAST   TECHNIQUE: Multidetector CT imaging of the head and cervical spine was performed following the standard protocol without intravenous contrast. Multiplanar CT image reconstructions of the cervical spine were also generated.  COMPARISON:  CT of the head performed 04/12/2015, and MRI/MRA of the brain from 09/13/2012  FINDINGS: CT HEAD FINDINGS  There is no evidence of acute infarction, mass lesion, or intra- or extra-axial hemorrhage on CT.  Prominence of the ventricles and sulci reflects moderate cortical volume loss. A chronic infarct is noted at the left occipital lobe, with associated encephalomalacia. Scattered periventricular and subcortical white matter change likely reflects small vessel ischemic microangiopathy. A chronic lacunar infarct is noted at the left thalamus. Mild cerebellar atrophy is noted.  The brainstem and fourth ventricle are within normal limits. No mass effect or midline shift is seen.  There is no evidence of fracture; visualized osseous structures are unremarkable in appearance. The orbits are within normal limits. The paranasal sinuses and mastoid air cells are well-aerated. Mild soft tissue swelling is noted overlying the right frontal calvarium.  CT CERVICAL SPINE FINDINGS  There is no evidence of acute fracture or subluxation. There is grade 1 retrolisthesis of C2 on C3, and grade 1 anterolisthesis of C4 on C5. There is grade 1 retrolisthesis of C5 on C6, and minimal grade 1 anterolisthesis of C6 on C7. Underlying facet  disease is noted. Multilevel disc space narrowing and endplate irregularity are seen along the cervical spine. Vertebral bodies demonstrate normal height. Prevertebral soft tissues are within normal limits.  Minimal calcification is noted at the right thyroid lobe, without a dominant mass. Scattered peripheral blebs are seen at the lung apices. Scattered calcification is seen at the carotid bifurcations bilaterally.  IMPRESSION: 1. No evidence of traumatic intracranial  injury or fracture. 2. No evidence of acute fracture or subluxation along the cervical spine. 3. Mild soft tissue swelling noted overlying the right frontal calvarium. 4. Moderate cortical volume loss and scattered small vessel ischemic microangiopathy noted. 5. Chronic infarct at the left occipital lobe, with associated encephalomalacia. Chronic lacunar infarct at the left thalamus. 6. Mild diffuse degenerative change along the cervical spine. 7. Scattered peripheral blebs noted at the lung apices. 8. Scattered calcification at the carotid bifurcations bilaterally. Carotid ultrasound could be considered for further evaluation, when and as deemed clinically appropriate.   Electronically Signed   By: Roanna Raider M.D.   On: 04/28/2015 23:28   Mr Brain Wo Contrast  04/29/2015   CLINICAL DATA:  Initial evaluation for acute fall, head injury.  EXAM: MRI HEAD WITHOUT CONTRAST  TECHNIQUE: Multiplanar, multiecho pulse sequences of the brain and surrounding structures were obtained without intravenous contrast.  COMPARISON:  Prior CT from 04/28/2015  FINDINGS: Diffuse prominence of the CSF containing spaces is compatible with generalized cerebral atrophy. Patchy and confluent T2/FLAIR hyperintensity involving the periventricular deep white matter both cerebral hemispheres most consistent with chronic small vessel ischemic changes, fairly advanced. Encephalomalacia within the left occipital lobe compatible with remote infarct. Additional scattered remote lacunar infarcts present within the bilateral basal ganglia.  No abnormal foci of restricted diffusion to suggest acute intracranial infarct. Gray-white matter differentiation maintained. Normal intravascular flow voids preserved. No acute intracranial hemorrhage. Chronic blood products seen associated with remote left occipital infarct.  No mass lesion or midline shift. No mass effect. No hydrocephalus. No extra-axial fluid collection.  Craniocervical junction within  normal limits. Degenerative changes noted within the upper cervical spine.  Pituitary gland within normal limits. No acute abnormality about the orbits. Paranasal sinuses and mastoid air cells are clear.  Small right forehead scalp contusion. Bone marrow signal intensity normal.  IMPRESSION: 1. No acute intracranial infarct or other abnormality identified. 2. Small right frontal scalp contusion. 3. Remote left occipital lobe infarct with additional remote lacunar infarcts involving the bilateral basal ganglia. 4. Advanced cerebral atrophy with chronic microvascular ischemic disease.   Electronically Signed   By: Rise Mu M.D.   On: 04/29/2015 01:38   Dg Chest Port 1 View  04/28/2015   CLINICAL DATA:  Fall with head injury.  Altered mental status  EXAM: PORTABLE CHEST - 1 VIEW  COMPARISON:  04/12/2015  FINDINGS: No cardiomegaly. There is a chronic lower mediastinal mass which reflects large hiatal hernia based on previously seen fluid level 12/22/2014. There is no edema, consolidation, effusion, or pneumothorax. Remote median sternotomy correlating with history of CABG. Changes correlating with history of right carotid endarterectomy.  Remote left rib fractures. Remote left humeral diaphysis fracture with failed/fractured rods and overriding. No acute osseous findings.  Cholelithiasis.  IMPRESSION: 1. No active disease. 2. Multiple incidental findings are stable from prior and noted above.   Electronically Signed   By: Marnee Spring M.D.   On: 04/28/2015 23:44    Scheduled Meds: . aspirin EC  81 mg Oral Daily  . atorvastatin  40 mg Oral  Daily  . docusate sodium  50 mg Oral BID  . feeding supplement (ENSURE ENLIVE)  237 mL Oral TID BM  . ferrous sulfate  325 mg Oral Q breakfast  . insulin aspart  0-9 Units Subcutaneous TID WC  . loratadine  10 mg Oral Daily  . metoprolol succinate  50 mg Oral Daily  . QUEtiapine  25-50 mg Oral TID  . traMADol  50 mg Oral BID   Continuous Infusions: .  sodium chloride 75 mL/hr at 04/29/15 0414    Active Problems:   Diabetes mellitus type 2, controlled   Dementia with behavioral disturbance   Elevated troponin   Scalp hematoma   Essential hypertension   Dementia    Time spent: >35 minutes     Esperanza SheetsBURIEV, Seena Face N  [facility] Pager [phone]. If 7PM-7AM, please contact night-coverage at [email or web address], password [email or web address] 04/29/2015, 2:11 PM  LOS: 0 days

## 2015-04-29 NOTE — Consult Note (Signed)
Consultation Note Date: 04/29/2015   Patient Name: Douglas May  DOB: 01/08/1924  MRN: [id number]  Age / Sex: 81 y.o., male   PCP: L.Lupe Carney, MD Referring Physician: Esperanza Sheets, MD  Reason for Consultation: Establishing goals of care  Palliative Care Assessment and Plan Summary of Established Goals of Care and Medical Treatment Preferences    Palliative Care Discussion Held Today Contacts/Participants in Discussion: Primary Decision Maker: daughter Douglas May: yes   Spoke with daughter Douglas May today.  Reviewed conversation she had with Dr Toniann Fail overnight.  She tells me that her dad has rapidly been declining since he moved here in December. Previously had been living in [place] until December but after he fell and broke his pelvis, he moved to [place] to be closer to her.  He has really struggled with loss of independence and cognitively/functionally going down hill.  This has accelerated after rib fx. Now he has not been eating for several days, not ambulating, more confusion.  Given his independence and decreased QOL, she wishes all his care devoted towards comfort.  She would like non-essential meds for comfort, artifical nutrition/hydration, etc to stop.  She does not want investigation of elevated troponin. We talked about potential for residential hospice and this is her goal. I have placed SW consult to help assist with this transition.  I have simplified med list to reflect comfort. Do not think we need psych eval.     Code Status/Advance Care Planning:  DNR  Symptom Management:   Acute Encephalopathy on Dementia- continue seroquel.haldol PRN and I will give dose now as well since he is so agitated. Can use benzo as backup  Pain- i will d/c morphine since he has allergy. IV dilaudid PRN.   Palliative Prophylaxis: senna  Psycho-social/Spiritual:   Support System: widowed x 84yrs. Recently at [facility]   Prognosis: < 2 weeks  Discharge Planning:   Hospice facility       Chief Complaint: Fall  History of Present Illness:  81 yo male with PMHx of Dementia, CAD, h/o CVA, DM admitted from SNF rehab with fall and increased confusion.  He has had rapid decline since admission few weeks ago for rib fracture and AKI.  On admission here he had elevated troponin and confusion. Able to get MRI which did not show any acute change but severe atrophy and prior ischemic changes. Admitting hospitalist discussed with daughter who states preference for comfort care. Palliative care called to assist in comfort care.  Laurens is very confused and agitated. He is unable to provide any history.   Primary Diagnoses  Present on Admission:  . Dementia with behavioral disturbance . Essential hypertension . Dementia   I have reviewed the medical record, interviewed the patient and family, and examined the patient. The following aspects are pertinent.  Past Medical History  Diagnosis Date  . Stroke   . Diabetes mellitus without complication   . CAD (coronary artery disease)   . Hypertension   . High cholesterol   . TIA (transient ischemic attack)    History   Social History  . Marital Status: Widowed    Spouse Name: N/A  . Number of Children: N/A  . Years of Education: N/A   Social History Main Topics  . Smoking status: Former Smoker -- 1.00 packs/day    Types: Cigarettes  . Smokeless tobacco: Never Used  . Alcohol Use: No  . Drug Use: No  . Sexual Activity: No   Other Topics Concern  .  None   Social History Narrative   Family History  Problem Relation Age of Onset  . Cancer Sister    Scheduled Meds: . docusate sodium  50 mg Oral BID  . haloperidol lactate  2 mg Intravenous Once  . LORazepam  0.5 mg Oral BID  . QUEtiapine  25 mg Oral TID  . traMADol  50 mg Oral BID   Continuous Infusions:  PRN Meds:.clonazePAM, haloperidol lactate, LORazepam, morphine injection, ondansetron **OR** ondansetron (ZOFRAN) IV Medications Prior to  Admission:  Prior to Admission medications   Medication Sig Start Date End Date Taking? Authorizing Provider  acetaminophen (TYLENOL) 325 MG tablet Take 650 mg by mouth Every 4 hours as needed   Yes Historical Provider, MD  alum & mag hydroxide-simeth (MAALOX/MYLANTA) 200-200-20 MG/5ML suspension Take 30 mLs by mouth every 6 (six) hours as needed for indigestion or heartburn.   Yes Historical Provider, MD  aspirin 81 MG tablet Take 81 mg by mouth daily.   Yes Historical Provider, MD  atorvastatin (LIPITOR) 40 MG tablet Take 40 mg by mouth daily.   Yes Historical Provider, MD  beta carotene w/minerals (OCUVITE) tablet Take 1 tablet by mouth 2 (two) times daily.    Yes Historical Provider, MD  CALCIUM-VITAMIN D PO Take 1 tablet by mouth daily.   Yes Historical Provider, MD  cetirizine (ZYRTEC) 10 MG tablet Take 10 mg by mouth daily.   Yes Historical Provider, MD  clonazePAM (KLONOPIN) 0.5 MG tablet Take 0.5 tablets (0.25 mg total) by mouth 3 (three) times daily as needed (anxiety). 04/17/15  Yes Starleen Arms, MD  docusate sodium (COLACE) 100 MG capsule Take 50 mg by mouth 2 (two) times daily.    Yes Historical Provider, MD  feeding supplement, ENSURE ENLIVE, (ENSURE ENLIVE) LIQD Take 237 mLs by mouth 3 (three) times daily between meals. 04/17/15  Yes Starleen Arms, MD  ferrous sulfate 325 (65 FE) MG tablet Take 325 mg by mouth daily with breakfast.   Yes Historical Provider, MD  insulin aspart (NOVOLOG) 100 UNIT/ML injection Inject 0-5 Units into the skin at bedtime. 04/17/15  Yes Starleen Arms, MD  metoprolol succinate (TOPROL-XL) 50 MG 24 hr tablet Take 50 mg by mouth daily. Take with or immediately following a meal.   Yes Historical Provider, MD  Multiple Vitamin (MULTIVITAMIN WITH MINERALS) TABS Take 1 tablet by mouth daily.   Yes Historical Provider, MD  nitroGLYCERIN (NITROSTAT) 0.4 MG SL tablet Place 0.4 mg under the tongue every 5 (five) minutes as needed. Chest pain   Yes  Historical Provider, MD  QUEtiapine (SEROQUEL) 25 MG tablet Take 1 tablet (25 mg total) by mouth 3 (three) times daily. Patient taking differently: Take 25-50 mg by mouth 3 (three) times daily. Take 25 mg twice daily  Take 50 mg at bedtime 04/17/15  Yes Starleen Arms, MD  traMADol (ULTRAM) 50 MG tablet Take 1 tablet (50 mg total) by mouth every 6 (six) hours as needed. Patient taking differently: Take 50 mg by mouth 2 (two) times daily.  04/06/15  Yes Marisa Severin, MD   Allergies  Allergen Reactions  . Ace Inhibitors Other (See Comments)  . Demerol [Meperidine] Other (See Comments)  . Morphine And Related Itching  . Penicillins Other (See Comments)    unknown  . Tetanus Toxoids Other (See Comments)   CBC:    Component Value Date/Time   WBC 11.5* 04/28/2015 2157   HGB 12.6* 04/28/2015 2204   HCT 37.0* 04/28/2015 2204  PLT 314 04/28/2015 2157   MCV 96.6 04/28/2015 2157   NEUTROABS 4.2 04/12/2015 1717   LYMPHSABS 1.6 04/12/2015 1717   MONOABS 0.5 04/12/2015 1717   EOSABS 0.2 04/12/2015 1717   BASOSABS 0.0 04/12/2015 1717   Comprehensive Metabolic Panel:    Component Value Date/Time   NA 143 04/28/2015 2204   K 3.8 04/28/2015 2204   CL 105 04/28/2015 2204   CO2 23 04/15/2015 0653   BUN 18 04/28/2015 2204   CREATININE 1.20 04/28/2015 2204   GLUCOSE 191* 04/28/2015 2204   CALCIUM 8.1* 04/15/2015 0653   AST 23 04/13/2015 0337   ALT 14* 04/13/2015 0337   ALKPHOS 78 04/13/2015 0337   BILITOT 0.7 04/13/2015 0337   PROT 5.4* 04/13/2015 0337   ALBUMIN 3.0* 04/13/2015 0337    Physical Exam: Vital Signs: BP 94/71 mmHg  Pulse 73  Temp(Src) 97.8 F (36.6 C) (Axillary)  Resp 16  Ht 5\' 2"  (1.575 m)  Wt 63.594 kg (140 lb 3.2 oz)  BMI 25.64 kg/m2  SpO2 99% SpO2: SpO2: 99 % O2 Device: O2 Device: Not Delivered O2 Flow Rate:   Intake/output summary: No intake or output data in the 24 hours ending 04/29/15 1553 LBM:   Baseline Weight: Weight: 63.594 kg (140 lb 3.2 oz) Most  recent weight: Weight: 63.594 kg (140 lb 3.2 oz)  Exam Findings:  GEN: confused, agitated HEENT: forehead laceration, dry mm CV: reg rate ABD: soft, ND EXT: warm        Palliative Performance Scale: 20              Additional Data Reviewed: Recent Labs     04/28/15  2157  04/28/15  2204  WBC  11.5*   --   HGB  11.8*  12.6*  PLT  314   --   NA   --   143  BUN   --   18  CREATININE   --   1.20    Total Time: 50 minutes Greater than 50%  of this time was spent counseling and coordinating care related to the above assessment and plan.  Signed by: Leland HerLAMPKIN,Doxie Augenstein JOHN, DO  Orvis BrillAaron J Tod Abrahamsen, DO  04/29/2015, 3:53 PM  Please contact Palliative Medicine Team phone at [phone] for questions and concerns.

## 2015-04-29 NOTE — Progress Notes (Signed)
MD at bedside. 

## 2015-04-29 NOTE — Progress Notes (Signed)
Nutrition Brief Note  Patient identified on the Malnutrition Screening Tool (MST) Report  Wt Readings from Last 15 Encounters:  04/29/15 140 lb 3.2 oz (63.594 kg)  04/13/15 148 lb 1.6 oz (67.178 kg)  11/22/14 148 lb (67.132 kg)  09/14/12 147 lb 1.6 oz (66.724 kg)    Body mass index is 25.64 kg/(m^2). Patient meets criteria for Overweight based on current BMI.   Current diet order is HH/Carb modified no documented intake of meals at this time. Labs and medications reviewed.   Daughter has requested Comfort Care at this time and a hospice consult has been placed.   No nutrition interventions warranted at this time. If nutrition issues arise, please consult RD.   Christophe LouisNathan Aryahna Spagna RD, LDN Nutrition Pager: [phone] 04/29/2015 8:27 AM

## 2015-04-29 NOTE — Progress Notes (Signed)
Pt arrived to 4n17. VS obtained. Bed lowered. Family at bedside. Will continue to monitor. Gara KronerHayles, Joffrey Kerce M, RN

## 2015-04-30 LAB — URINE CULTURE: Colony Count: 6000

## 2015-04-30 MED ORDER — SENNA 8.6 MG PO TABS: 1.0000 | ORAL_TABLET | Freq: Every day | ORAL | Status: DC

## 2015-04-30 NOTE — Progress Notes (Signed)
[facility] PROGRESS NOTE  PIERRE DELLAROCCO ZOX:[id number] DOB: [birth year] DOA: 04/28/2015 PCP: Lupe Carney, MD  Assessment/Plan: 81 y/o male with PMH of HTN, CAD, DM, h/o CVA, advanced dementia recently admitted to the hospital secondary to acute renal failure and rib fractures and was eventually discharged to a rehabilitation was brought to the ER the patient had a fall at the rehabilitation. Patient was progressively declined in health, confused. FTT  -ED: X ray/MRI: no acute findings. Admitting physician d/w family who requested comfort care   1. Advanced dementia, behavioral changes, encephalopathy, delirium. Recurrent CVA, falls, progressive decline FTT.  -prognosis is poor. Palliative care d/w family who requested hospice care. . Patient is DNR. appriciate palliative care   2. Encephalopathy, del;irium with advanced dementia   -cont ativan 0.5 BID, cont Seroquel. Prn haldol.  Clonopin   Cont comfort care. Discontinue unnecessary meds.   Code Status: DNR Family Communication: d/w patient. No family is at the bedside  (indicate person spoken with, relationship, and if by phone, the number) Disposition Plan: hospice when available    Consultants:  Palliative care   Procedures:  none  Antibiotics:  none (indicate start date, and stop date if known)  HPI/Subjective: Sleepy   Objective: Filed Vitals:   04/30/15 0500  BP: 130/66  Pulse: 72  Temp: 98 F (36.7 C)  Resp: 18    Intake/Output Summary (Last 24 hours) at 04/30/15 0949 Last data filed at 04/30/15 0500  Gross per 24 hour  Intake      0 ml  Output    400 ml  Net   -400 ml   Filed Weights   04/29/15 0127  Weight: 63.594 kg (140 lb 3.2 oz)    Exam:   General:  Somnolent   Cardiovascular: s1,s2 rrr  Respiratory: CTA BL  Abdomen: soft, nt, nd   Musculoskeletal: no leg edema   Data Reviewed: Basic Metabolic Panel:  Recent Labs Lab 04/28/15 2204  NA 143  K 3.8  CL 105  GLUCOSE  191*  BUN 18  CREATININE 1.20   Liver Function Tests: No results for input(s): AST, ALT, ALKPHOS, BILITOT, PROT, ALBUMIN in the last 168 hours. No results for input(s): LIPASE, AMYLASE in the last 168 hours. No results for input(s): AMMONIA in the last 168 hours. CBC:  Recent Labs Lab 04/28/15 2157 04/28/15 2204  WBC 11.5*  --   HGB 11.8* 12.6*  HCT 36.5* 37.0*  MCV 96.6  --   PLT 314  --    Cardiac Enzymes: No results for input(s): CKTOTAL, CKMB, CKMBINDEX, TROPONINI in the last 168 hours. BNP (last 3 results) No results for input(s): BNP in the last 8760 hours.  ProBNP (last 3 results) No results for input(s): PROBNP in the last 8760 hours.  CBG:  Recent Labs Lab 04/29/15 0737 04/29/15 1211  GLUCAP 147* 103*    No results found for this or any previous visit (from the past 240 hour(s)).   Studies: Ct Head Wo Contrast  04/28/2015   CLINICAL DATA:  Status post fall out of wheelchair to floor, and hit head. Right-sided scalp hematoma. Concern for cervical spine injury. Initial encounter.  EXAM: CT HEAD WITHOUT CONTRAST  CT CERVICAL SPINE WITHOUT CONTRAST  TECHNIQUE: Multidetector CT imaging of the head and cervical spine was performed following the standard protocol without intravenous contrast. Multiplanar CT image reconstructions of the cervical spine were also generated.  COMPARISON:  CT of the head performed 04/12/2015, and MRI/MRA of the brain from  09/13/2012  FINDINGS: CT HEAD FINDINGS  There is no evidence of acute infarction, mass lesion, or intra- or extra-axial hemorrhage on CT.  Prominence of the ventricles and sulci reflects moderate cortical volume loss. A chronic infarct is noted at the left occipital lobe, with associated encephalomalacia. Scattered periventricular and subcortical white matter change likely reflects small vessel ischemic microangiopathy. A chronic lacunar infarct is noted at the left thalamus. Mild cerebellar atrophy is noted.  The brainstem and  fourth ventricle are within normal limits. No mass effect or midline shift is seen.  There is no evidence of fracture; visualized osseous structures are unremarkable in appearance. The orbits are within normal limits. The paranasal sinuses and mastoid air cells are well-aerated. Mild soft tissue swelling is noted overlying the right frontal calvarium.  CT CERVICAL SPINE FINDINGS  There is no evidence of acute fracture or subluxation. There is grade 1 retrolisthesis of C2 on C3, and grade 1 anterolisthesis of C4 on C5. There is grade 1 retrolisthesis of C5 on C6, and minimal grade 1 anterolisthesis of C6 on C7. Underlying facet disease is noted. Multilevel disc space narrowing and endplate irregularity are seen along the cervical spine. Vertebral bodies demonstrate normal height. Prevertebral soft tissues are within normal limits.  Minimal calcification is noted at the right thyroid lobe, without a dominant mass. Scattered peripheral blebs are seen at the lung apices. Scattered calcification is seen at the carotid bifurcations bilaterally.  IMPRESSION: 1. No evidence of traumatic intracranial injury or fracture. 2. No evidence of acute fracture or subluxation along the cervical spine. 3. Mild soft tissue swelling noted overlying the right frontal calvarium. 4. Moderate cortical volume loss and scattered small vessel ischemic microangiopathy noted. 5. Chronic infarct at the left occipital lobe, with associated encephalomalacia. Chronic lacunar infarct at the left thalamus. 6. Mild diffuse degenerative change along the cervical spine. 7. Scattered peripheral blebs noted at the lung apices. 8. Scattered calcification at the carotid bifurcations bilaterally. Carotid ultrasound could be considered for further evaluation, when and as deemed clinically appropriate.   Electronically Signed   By: Roanna Raider M.D.   On: 04/28/2015 23:28   Ct Cervical Spine Wo Contrast  04/28/2015   CLINICAL DATA:  Status post fall out of  wheelchair to floor, and hit head. Right-sided scalp hematoma. Concern for cervical spine injury. Initial encounter.  EXAM: CT HEAD WITHOUT CONTRAST  CT CERVICAL SPINE WITHOUT CONTRAST  TECHNIQUE: Multidetector CT imaging of the head and cervical spine was performed following the standard protocol without intravenous contrast. Multiplanar CT image reconstructions of the cervical spine were also generated.  COMPARISON:  CT of the head performed 04/12/2015, and MRI/MRA of the brain from 09/13/2012  FINDINGS: CT HEAD FINDINGS  There is no evidence of acute infarction, mass lesion, or intra- or extra-axial hemorrhage on CT.  Prominence of the ventricles and sulci reflects moderate cortical volume loss. A chronic infarct is noted at the left occipital lobe, with associated encephalomalacia. Scattered periventricular and subcortical white matter change likely reflects small vessel ischemic microangiopathy. A chronic lacunar infarct is noted at the left thalamus. Mild cerebellar atrophy is noted.  The brainstem and fourth ventricle are within normal limits. No mass effect or midline shift is seen.  There is no evidence of fracture; visualized osseous structures are unremarkable in appearance. The orbits are within normal limits. The paranasal sinuses and mastoid air cells are well-aerated. Mild soft tissue swelling is noted overlying the right frontal calvarium.  CT CERVICAL SPINE FINDINGS  There is no evidence of acute fracture or subluxation. There is grade 1 retrolisthesis of C2 on C3, and grade 1 anterolisthesis of C4 on C5. There is grade 1 retrolisthesis of C5 on C6, and minimal grade 1 anterolisthesis of C6 on C7. Underlying facet disease is noted. Multilevel disc space narrowing and endplate irregularity are seen along the cervical spine. Vertebral bodies demonstrate normal height. Prevertebral soft tissues are within normal limits.  Minimal calcification is noted at the right thyroid lobe, without a dominant mass.  Scattered peripheral blebs are seen at the lung apices. Scattered calcification is seen at the carotid bifurcations bilaterally.  IMPRESSION: 1. No evidence of traumatic intracranial injury or fracture. 2. No evidence of acute fracture or subluxation along the cervical spine. 3. Mild soft tissue swelling noted overlying the right frontal calvarium. 4. Moderate cortical volume loss and scattered small vessel ischemic microangiopathy noted. 5. Chronic infarct at the left occipital lobe, with associated encephalomalacia. Chronic lacunar infarct at the left thalamus. 6. Mild diffuse degenerative change along the cervical spine. 7. Scattered peripheral blebs noted at the lung apices. 8. Scattered calcification at the carotid bifurcations bilaterally. Carotid ultrasound could be considered for further evaluation, when and as deemed clinically appropriate.   Electronically Signed   By: Roanna RaiderJeffery  Chang M.D.   On: 04/28/2015 23:28   Mr Brain Wo Contrast  04/29/2015   CLINICAL DATA:  Initial evaluation for acute fall, head injury.  EXAM: MRI HEAD WITHOUT CONTRAST  TECHNIQUE: Multiplanar, multiecho pulse sequences of the brain and surrounding structures were obtained without intravenous contrast.  COMPARISON:  Prior CT from 04/28/2015  FINDINGS: Diffuse prominence of the CSF containing spaces is compatible with generalized cerebral atrophy. Patchy and confluent T2/FLAIR hyperintensity involving the periventricular deep white matter both cerebral hemispheres most consistent with chronic small vessel ischemic changes, fairly advanced. Encephalomalacia within the left occipital lobe compatible with remote infarct. Additional scattered remote lacunar infarcts present within the bilateral basal ganglia.  No abnormal foci of restricted diffusion to suggest acute intracranial infarct. Gray-white matter differentiation maintained. Normal intravascular flow voids preserved. No acute intracranial hemorrhage. Chronic blood products  seen associated with remote left occipital infarct.  No mass lesion or midline shift. No mass effect. No hydrocephalus. No extra-axial fluid collection.  Craniocervical junction within normal limits. Degenerative changes noted within the upper cervical spine.  Pituitary gland within normal limits. No acute abnormality about the orbits. Paranasal sinuses and mastoid air cells are clear.  Small right forehead scalp contusion. Bone marrow signal intensity normal.  IMPRESSION: 1. No acute intracranial infarct or other abnormality identified. 2. Small right frontal scalp contusion. 3. Remote left occipital lobe infarct with additional remote lacunar infarcts involving the bilateral basal ganglia. 4. Advanced cerebral atrophy with chronic microvascular ischemic disease.   Electronically Signed   By: Rise MuBenjamin  McClintock M.D.   On: 04/29/2015 01:38   Dg Chest Port 1 View  04/28/2015   CLINICAL DATA:  Fall with head injury.  Altered mental status  EXAM: PORTABLE CHEST - 1 VIEW  COMPARISON:  04/12/2015  FINDINGS: No cardiomegaly. There is a chronic lower mediastinal mass which reflects large hiatal hernia based on previously seen fluid level 12/22/2014. There is no edema, consolidation, effusion, or pneumothorax. Remote median sternotomy correlating with history of CABG. Changes correlating with history of right carotid endarterectomy.  Remote left rib fractures. Remote left humeral diaphysis fracture with failed/fractured rods and overriding. No acute osseous findings.  Cholelithiasis.  IMPRESSION: 1. No active disease. 2.  Multiple incidental findings are stable from prior and noted above.   Electronically Signed   By: Marnee Spring M.D.   On: 04/28/2015 23:44    Scheduled Meds: . docusate sodium  50 mg Oral BID  . LORazepam  0.5 mg Oral BID  . QUEtiapine  25 mg Oral TID  . traMADol  50 mg Oral BID   Continuous Infusions:    Active Problems:   Diabetes mellitus type 2, controlled   Dementia with  behavioral disturbance   Elevated troponin   Scalp hematoma   Essential hypertension   Dementia   Palliative care encounter   Acute encephalopathy    Time spent: >35 minutes     Esperanza Sheets  [facility] Pager [phone]. If 7PM-7AM, please contact night-coverage at [email or web address], password [facility] 04/30/2015, 9:49 AM  LOS: 1 day

## 2015-04-30 NOTE — Progress Notes (Signed)
Daily Progress Note   Patient Name: Douglas ShutterCharles F May       Date: 04/30/2015 DOB: February 16, 1924  Age: 81 y.o. MRN#: [id number] Attending Physician: Esperanza SheetsUlugbek N Buriev, MD Primary Care Physician: Lupe Carneyean Mitchell, MD Admit Date: 04/28/2015  Reason for Consultation/Follow-up: Establishing goals of care  Subjective: Resting comfortable. Agitates when aroused  Length of Stay: 1 day  Current Medications: Scheduled Meds:  . docusate sodium  50 mg Oral BID  . LORazepam  0.5 mg Oral BID  . QUEtiapine  25 mg Oral TID  . traMADol  50 mg Oral BID    Continuous Infusions:    PRN Meds: atropine, haloperidol lactate, HYDROmorphone (DILAUDID) injection, LORazepam, ondansetron **OR** ondansetron (ZOFRAN) IV  Palliative Performance Scale: 20%     Vital Signs: BP 130/66 mmHg  Pulse 72  Temp(Src) 98 F (36.7 C) (Axillary)  Resp 18  Ht 5\' 2"  (1.575 m)  Wt 63.594 kg (140 lb 3.2 oz)  BMI 25.64 kg/m2  SpO2 95% SpO2: SpO2: 95 % O2 Device: O2 Device: Not Delivered O2 Flow Rate:    Intake/output summary:  Intake/Output Summary (Last 24 hours) at 04/30/15 1130 Last data filed at 04/30/15 1041  Gross per 24 hour  Intake      0 ml  Output    800 ml  Net   -800 ml   LBM:   Baseline Weight: Weight: 63.594 kg (140 lb 3.2 oz) Most recent weight: Weight: 63.594 kg (140 lb 3.2 oz)  Physical Exam: GEN: resting, agitates on arousal HEENT: forehead laceration, dry mm CV: regular rate     Additional Data Reviewed: Recent Labs     04/28/15  2157  04/28/15  2204  WBC  11.5*   --   HGB  11.8*  12.6*  PLT  314   --   NA   --   143  BUN   --   18  CREATININE   --   1.20     Problem List:  Patient Active Problem List   Diagnosis Date Noted  . Altered mental status 04/29/2015  . Elevated troponin 04/29/2015  . Scalp hematoma 04/29/2015  . Essential hypertension 04/29/2015  . Dementia 04/29/2015  . Palliative care encounter   . Acute encephalopathy   . Fall 04/13/2015  . Dementia  with behavioral disturbance 04/13/2015  . Rib fractures 04/12/2015  . AKI (acute kidney injury) 04/12/2015  . Onychomycosis 12/05/2014  . Pain in lower limb 12/05/2014  . Ulcer of toe of left foot 12/05/2014  . Mitral valve regurgitation, moderate 09/14/2012  . Ruptured chordae tendineae 09/14/2012  . Brain aneurysm, 2.755mm in left posterior communicating artery 09/13/2012  . Cerebral microvascular disease 09/13/2012  . Syncope and collapse 09/12/2012  . HTN (hypertension) 09/12/2012  . Diabetes mellitus type 2, controlled 09/12/2012  . Hx of CABG 09/12/2012  . CV[place]  05/2012 in [place] 09/12/2012  . Hypercholesteremia 09/12/2012  . Anemia 09/12/2012     Palliative Care Assessment & Plan    Code Status:  DNR  Goals of Care:  Comfort. Plan for residential hospice.   3. Symptom Management:  Acute Encephalopathy on Dementia- controlled but agitates when stimulated. Continue current regimen as achieving goal of comfort  Pain-prn dilaudid, morphine allergy  Palliative Prophylaxis: senna  4. Prognosis: < 2 weeks  5. Discharge Planning: Hospice facility   Care plan was discussed with Dr York SpanielBuriev  Thank you for allowing the Palliative Medicine Team to assist in the care of this  patient.   Total time: 15 minutes Greater than 50%  of this time was spent counseling and coordinating care related to the above assessment and plan.   Orvis Brill, DO  04/30/2015, 11:30 AM  Please contact Palliative Medicine Team phone at [phone] for questions and concerns.

## 2015-05-01 DIAGNOSIS — I10 Essential (primary) hypertension: Secondary | ICD-10-CM

## 2015-05-01 DIAGNOSIS — G9340 Encephalopathy, unspecified: Principal | ICD-10-CM

## 2015-05-01 DIAGNOSIS — R7989 Other specified abnormal findings of blood chemistry: Secondary | ICD-10-CM

## 2015-05-01 DIAGNOSIS — F0391 Unspecified dementia with behavioral disturbance: Secondary | ICD-10-CM

## 2015-05-01 DIAGNOSIS — E119 Type 2 diabetes mellitus without complications: Secondary | ICD-10-CM

## 2015-05-01 DIAGNOSIS — Z515 Encounter for palliative care: Secondary | ICD-10-CM

## 2015-05-01 MED ORDER — QUETIAPINE FUMARATE 25 MG PO TABS: 25.0000 mg | ORAL_TABLET | Freq: Three times a day (TID) | ORAL | Status: AC

## 2015-05-01 MED ORDER — ATROPINE SULFATE 1 % OP SOLN: 2.0000 [drp] | OPHTHALMIC | Status: AC | PRN

## 2015-05-01 MED ORDER — LORAZEPAM 0.5 MG PO TABS: 0.5000 mg | ORAL_TABLET | Freq: Two times a day (BID) | ORAL | Status: AC

## 2015-05-01 MED ORDER — TRAMADOL HCL 50 MG PO TABS: 50.0000 mg | ORAL_TABLET | Freq: Four times a day (QID) | ORAL | Status: AC | PRN

## 2015-05-01 NOTE — Progress Notes (Signed)
[facility]                                                                              Patient Demographics  Douglas May, is a 81 y.o. male, DOB - [birth year], ZOX:[id number]  Admit date - 04/28/2015   Admitting Physician Eduard Clos, MD  Outpatient Primary MD for the patient is Lupe Carney, MD  LOS - 2   Chief Complaint  Patient presents with  . Fall  . Head Injury       Brief HPI  Patient is a 81 year old male with HTN, CAD, DM, h/o CVA, advanced dementia recently admitted to the hospital secondary to acute renal failure and rib fractures and was eventually discharged to a rehabilitation was brought to the ER the patient had a fall at the rehabilitation. Patient was progressively declined in health, confused.    Assessment & Plan    Advanced dementia with behavioral changes, encephalopathy and delirium - Overall poor prognosis, with failure to thrive, having falls and recurrent CVA - Palliative medicine was consulted and discussed in detail with the family and requested hospice care, currently awaiting facility    Diabetes mellitus type 2, controlled - Currently comfort care  Acute encephalopathy with delirium, advanced dementia Continue Ativan, Seroquel, as needed Haldol   Code Status: DNR  Family Communication: No family member at the bedside  Disposition Plan: Awaiting residential hospice  Time Spent in minutes   15 minutes  Procedures  None   Consults   Palliative medicine  DVT Prophylaxis   SCD's  Medications  Scheduled Meds: . LORazepam  0.5 mg Oral BID  . QUEtiapine  25 mg Oral TID  . senna  1 tablet Oral QHS  . traMADol  50 mg Oral BID   Continuous Infusions:  PRN Meds:.atropine, haloperidol lactate, HYDROmorphone (DILAUDID) injection, LORazepam, ondansetron **OR** ondansetron (ZOFRAN) IV   Antibiotics   Anti-infectives    None        Subjective:   Douglas May was seen and examined today.  Confused, appears comfortable, sitter at bedside  Objective:   Blood pressure 130/66, pulse 72, temperature 98 F (36.7 C), temperature source Axillary, resp. rate 18, height 5\' 2"  (1.575 m), weight 63.594 kg (140 lb 3.2 oz), SpO2 95 %.  Wt Readings from Last 3 Encounters:  04/29/15 63.594 kg (140 lb 3.2 oz)  04/13/15 67.178 kg (148 lb 1.6 oz)  11/22/14 67.132 kg (148 lb)    No intake or output data in the 24 hours ending 05/01/15 1041  Exam  General: confused, not following commands,  HEENT:  PERRLA, EOMI  Neck: Supple, no JVD, no masses  CVS: S1 S2 auscultated, no rubs, murmurs or gallops. Regular rate and rhythm.  Respiratory: Clear to auscultation bilaterally, no wheezing, rales or rhonchi  Abdomen: Soft, nontender, nondistended, + bowel sounds  Ext: no cyanosis clubbing or edema  Neuro: unable to assess, confused   Skin: No rashes  Psych: Confused, not following verbal commands    Data Review   Micro Results Recent Results (from the past 240 hour(s))  Urine culture     Status: None   Collection  Time: 04/28/15 11:45 PM  Result Value Ref Range Status   Specimen Description URINE, CATHETERIZED  Final   Special Requests NONE  Final   Colony Count   Final    6,000 COLONIES/ML Performed at [facility]    Culture   Final    INSIGNIFICANT GROWTH Performed at [facility]    Report Status 04/30/2015 FINAL  Final    Radiology Reports Dg Chest 2 View  04/06/2015   CLINICAL DATA:  Right lateral chest wall pain.  Cough for 2 days.  EXAM: CHEST  2 VIEW  COMPARISON:  12/22/2014  FINDINGS: There is a hiatal hernia. There is moderate unchanged cardiomegaly and aortic tortuosity. The lungs are clear. The pulmonary vasculature is normal. There are no effusions.  Incidentally noted chronic override a left humeral fracture.  IMPRESSION: Cardiomegaly.  Hiatal hernia.  No acute cardiopulmonary findings.   Electronically Signed   By: Ellery Plunk M.D.    On: 04/06/2015 05:12   Dg Ribs Unilateral W/chest Right  04/12/2015   CLINICAL DATA:  Patient found on floor of home. Generalize right-sided pain. Dementia. Uncooperative and combative  EXAM: RIGHT RIBS AND CHEST - 3+ VIEW  COMPARISON:  04/06/2015  FINDINGS: There is a normal heart size. Previous median sternotomy and CABG procedure. No pleural effusion or edema noted. Scar identified in the right base. Chronic fracture deformity involving the proximal left humerus is again noted with over ride of the fracture fragments. 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 There are acute fractures involving the right anterior 7th, 8th, and 9th ribs. Marked scoliosis deformity involves the thoracic and lumbar spine.  IMPRESSION: 1. Acute right anterior rib fractures. 2. Chronic fracture deformity involves the proximal left humerus.   Electronically Signed   By: Signa Kell M.D.   On: 04/12/2015 18:39   Ct Head Wo Contrast  04/28/2015   CLINICAL DATA:  Status post fall out of wheelchair to floor, and hit head. Right-sided scalp hematoma. Concern for cervical spine injury. Initial encounter.  EXAM: CT HEAD WITHOUT CONTRAST  CT CERVICAL SPINE WITHOUT CONTRAST  TECHNIQUE: Multidetector CT imaging of the head and cervical spine was performed following the standard protocol without intravenous contrast. Multiplanar CT image reconstructions of the cervical spine were also generated.  COMPARISON:  CT of the head performed 04/12/2015, and MRI/MRA of the brain from 09/13/2012  FINDINGS: CT HEAD FINDINGS  There is no evidence of acute infarction, mass lesion, or intra- or extra-axial hemorrhage on CT.  Prominence of the ventricles and sulci reflects moderate cortical volume loss. A chronic infarct is noted at the left occipital lobe, with associated encephalomalacia. Scattered periventricular and subcortical white matter change likely reflects small vessel ischemic microangiopathy. A chronic lacunar infarct  is noted at the left thalamus. Mild cerebellar atrophy is noted.  The brainstem and fourth ventricle are within normal limits. No mass effect or midline shift is seen.  There is no evidence of fracture; visualized osseous structures are unremarkable in appearance. The orbits are within normal limits. The paranasal sinuses and mastoid air cells are well-aerated. Mild soft tissue swelling is noted overlying the right frontal calvarium.  CT CERVICAL SPINE FINDINGS  There is no evidence of acute fracture or subluxation. There is grade 1 retrolisthesis of C2 on C3, and grade 1 anterolisthesis of C4 on C5. There is grade 1 retrolisthesis of C5 on C6, and minimal grade 1 anterolisthesis of C6 on C7. Underlying facet disease is noted. Multilevel disc space  narrowing and endplate irregularity are seen along the cervical spine. Vertebral bodies demonstrate normal height. Prevertebral soft tissues are within normal limits.  Minimal calcification is noted at the right thyroid lobe, without a dominant mass. Scattered peripheral blebs are seen at the lung apices. Scattered calcification is seen at the carotid bifurcations bilaterally.  IMPRESSION: 1. No evidence of traumatic intracranial injury or fracture. 2. No evidence of acute fracture or subluxation along the cervical spine. 3. Mild soft tissue swelling noted overlying the right frontal calvarium. 4. Moderate cortical volume loss and scattered small vessel ischemic microangiopathy noted. 5. Chronic infarct at the left occipital lobe, with associated encephalomalacia. Chronic lacunar infarct at the left thalamus. 6. Mild diffuse degenerative change along the cervical spine. 7. Scattered peripheral blebs noted at the lung apices. 8. Scattered calcification at the carotid bifurcations bilaterally. Carotid ultrasound could be considered for further evaluation, when and as deemed clinically appropriate.   Electronically Signed   By: Roanna RaiderJeffery  Chang M.D.   On: 04/28/2015 23:28    Ct Head Wo Contrast  04/12/2015   CLINICAL DATA:  Altered mental status.  Gait problem.  EXAM: CT HEAD WITHOUT CONTRAST  TECHNIQUE: Contiguous axial images were obtained from the base of the skull through the vertex without intravenous contrast.  COMPARISON:  09/12/2012  FINDINGS: Encephalomalacia within the left occipital lobe is again noted compatible with previous infarct. There is diffuse low attenuation throughout the subcortical and periventricular white matter compatible with chronic microvascular disease. Old left alignment lacunar infarct is again noted and appears unchanged. Chronic bilateral lacunar infarcts within the basal ganglia are noted. No evidence for acute cortical stroke, intracranial hemorrhage or mass. Prominence of the sulci and ventricles noted consistent with brain atrophy. The mastoid air cells and the paranasal sinuses are clear. The calvarium is intact.  IMPRESSION: 1. Small vessel ischemic disease and brain atrophy. 2. Left occipital lobe encephalomalacia compatible with old infarct. 3. No acute intracranial abnormalities.   Electronically Signed   By: Signa Kellaylor  Stroud M.D.   On: 04/12/2015 18:54   Ct Cervical Spine Wo Contrast  04/28/2015   CLINICAL DATA:  Status post fall out of wheelchair to floor, and hit head. Right-sided scalp hematoma. Concern for cervical spine injury. Initial encounter.  EXAM: CT HEAD WITHOUT CONTRAST  CT CERVICAL SPINE WITHOUT CONTRAST  TECHNIQUE: Multidetector CT imaging of the head and cervical spine was performed following the standard protocol without intravenous contrast. Multiplanar CT image reconstructions of the cervical spine were also generated.  COMPARISON:  CT of the head performed 04/12/2015, and MRI/MRA of the brain from 09/13/2012  FINDINGS: CT HEAD FINDINGS  There is no evidence of acute infarction, mass lesion, or intra- or extra-axial hemorrhage on CT.  Prominence of the ventricles and sulci reflects moderate cortical volume loss. A  chronic infarct is noted at the left occipital lobe, with associated encephalomalacia. Scattered periventricular and subcortical white matter change likely reflects small vessel ischemic microangiopathy. A chronic lacunar infarct is noted at the left thalamus. Mild cerebellar atrophy is noted.  The brainstem and fourth ventricle are within normal limits. No mass effect or midline shift is seen.  There is no evidence of fracture; visualized osseous structures are unremarkable in appearance. The orbits are within normal limits. The paranasal sinuses and mastoid air cells are well-aerated. Mild soft tissue swelling is noted overlying the right frontal calvarium.  CT CERVICAL SPINE FINDINGS  There is no evidence of acute fracture or subluxation. There is grade 1 retrolisthesis of  C2 on C3, and grade 1 anterolisthesis of C4 on C5. There is grade 1 retrolisthesis of C5 on C6, and minimal grade 1 anterolisthesis of C6 on C7. Underlying facet disease is noted. Multilevel disc space narrowing and endplate irregularity are seen along the cervical spine. Vertebral bodies demonstrate normal height. Prevertebral soft tissues are within normal limits.  Minimal calcification is noted at the right thyroid lobe, without a dominant mass. Scattered peripheral blebs are seen at the lung apices. Scattered calcification is seen at the carotid bifurcations bilaterally.  IMPRESSION: 1. No evidence of traumatic intracranial injury or fracture. 2. No evidence of acute fracture or subluxation along the cervical spine. 3. Mild soft tissue swelling noted overlying the right frontal calvarium. 4. Moderate cortical volume loss and scattered small vessel ischemic microangiopathy noted. 5. Chronic infarct at the left occipital lobe, with associated encephalomalacia. Chronic lacunar infarct at the left thalamus. 6. Mild diffuse degenerative change along the cervical spine. 7. Scattered peripheral blebs noted at the lung apices. 8. Scattered  calcification at the carotid bifurcations bilaterally. Carotid ultrasound could be considered for further evaluation, when and as deemed clinically appropriate.   Electronically Signed   By: Roanna Raider M.D.   On: 04/28/2015 23:28   Mr Brain Wo Contrast  04/29/2015   CLINICAL DATA:  Initial evaluation for acute fall, head injury.  EXAM: MRI HEAD WITHOUT CONTRAST  TECHNIQUE: Multiplanar, multiecho pulse sequences of the brain and surrounding structures were obtained without intravenous contrast.  COMPARISON:  Prior CT from 04/28/2015  FINDINGS: Diffuse prominence of the CSF containing spaces is compatible with generalized cerebral atrophy. Patchy and confluent T2/FLAIR hyperintensity involving the periventricular deep white matter both cerebral hemispheres most consistent with chronic small vessel ischemic changes, fairly advanced. Encephalomalacia within the left occipital lobe compatible with remote infarct. Additional scattered remote lacunar infarcts present within the bilateral basal ganglia.  No abnormal foci of restricted diffusion to suggest acute intracranial infarct. Gray-white matter differentiation maintained. Normal intravascular flow voids preserved. No acute intracranial hemorrhage. Chronic blood products seen associated with remote left occipital infarct.  No mass lesion or midline shift. No mass effect. No hydrocephalus. No extra-axial fluid collection.  Craniocervical junction within normal limits. Degenerative changes noted within the upper cervical spine.  Pituitary gland within normal limits. No acute abnormality about the orbits. Paranasal sinuses and mastoid air cells are clear.  Small right forehead scalp contusion. Bone marrow signal intensity normal.  IMPRESSION: 1. No acute intracranial infarct or other abnormality identified. 2. Small right frontal scalp contusion. 3. Remote left occipital lobe infarct with additional remote lacunar infarcts involving the bilateral basal ganglia. 4.  Advanced cerebral atrophy with chronic microvascular ischemic disease.   Electronically Signed   By: Rise Mu M.D.   On: 04/29/2015 01:38   Dg Chest Port 1 View  04/28/2015   CLINICAL DATA:  Fall with head injury.  Altered mental status  EXAM: PORTABLE CHEST - 1 VIEW  COMPARISON:  04/12/2015  FINDINGS: No cardiomegaly. There is a chronic lower mediastinal mass which reflects large hiatal hernia based on previously seen fluid level 12/22/2014. There is no edema, consolidation, effusion, or pneumothorax. Remote median sternotomy correlating with history of CABG. Changes correlating with history of right carotid endarterectomy.  Remote left rib fractures. Remote left humeral diaphysis fracture with failed/fractured rods and overriding. No acute osseous findings.  Cholelithiasis.  IMPRESSION: 1. No active disease. 2. Multiple incidental findings are stable from prior and noted above.   Electronically Signed  By: Marnee Spring M.D.   On: 04/28/2015 23:44   Dg Hip Unilat With Pelvis 2-3 Views Right  04/12/2015   CLINICAL DATA:  Right-sided hip pain.  EXAM: RIGHT HIP (WITH PELVIS) 2-3 VIEWS  COMPARISON:  None.  FINDINGS: Severe degenerative joint disease is seen involving both hip joints. No definite fracture or dislocation is noted. Vascular calcifications are seen.  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 No acute abnormality seen in the right hip.   Electronically Signed   By: Lupita Raider, M.D.   On: 04/12/2015 18:33    CBC  Recent Labs Lab 04/28/15 2157 04/28/15 2204  WBC 11.5*  --   HGB 11.8* 12.6*  HCT 36.5* 37.0*  PLT 314  --   MCV 96.6  --   MCH 31.2  --   MCHC 32.3  --   RDW 13.3  --     Chemistries   Recent Labs Lab 04/28/15 2204  NA 143  K 3.8  CL 105  GLUCOSE 191*  BUN 18  CREATININE 1.20   ------------------------------------------------------------------------------------------------------------------ estimated creatinine clearance  is 32.2 mL/min (by C-G formula based on Cr of 1.2). ------------------------------------------------------------------------------------------------------------------ No results for input(s): HGBA1C in the last 72 hours. ------------------------------------------------------------------------------------------------------------------ No results for input(s): CHOL, HDL, LDLCALC, TRIG, CHOLHDL, LDLDIRECT in the last 72 hours. ------------------------------------------------------------------------------------------------------------------ No results for input(s): TSH, T4TOTAL, T3FREE, THYROIDAB in the last 72 hours.  Invalid input(s): FREET3 ------------------------------------------------------------------------------------------------------------------ No results for input(s): VITAMINB12, FOLATE, FERRITIN, TIBC, IRON, RETICCTPCT in the last 72 hours.  Coagulation profile No results for input(s): INR, PROTIME in the last 168 hours.  No results for input(s): DDIMER in the last 72 hours.  Cardiac Enzymes No results for input(s): CKMB, TROPONINI, MYOGLOBIN in the last 168 hours.  Invalid input(s): CK ------------------------------------------------------------------------------------------------------------------ Invalid input(s): POCBNP   Recent Labs  04/29/15 0737 04/29/15 1211  GLUCAP 147* 103*     Bitha Fauteux M.D. [facility] 05/01/2015, 10:41 AM  Pager: [phone]   Between 7am to 7pm - call Pager - [phone]  After 7pm go to [email or web address] - password [email or web address]  Call night coverage person covering after 7pm

## 2015-05-01 NOTE — Clinical Social Work Note (Addendum)
Clinical Social Work Assessment  Patient Details  Name: Douglas ShutterCharles F Correll MRN: [id number] Date of Birth: 02/12/1924  Date of referral:  04/29/15               Reason for consult:  End of Life/Hospice              Housing/Transportation Living arrangements for the past 2 months:  Single Family Home Source of Information:  Adult Children Patient Interpreter Needed:  None Criminal Activity/Legal Involvement Pertinent to Current Situation/Hospitalization:  No - Comment as needed Significant Relationships:  None Lives with:  Adult Children Do you feel safe going back to the place where you live?  No Need for family participation in patient care:  Yes (Comment)  Care giving concerns: Patty reported that she can not provided adequate care for the pt.    Social Worker assessment / plan:  CSW spoke with the pt's daughter Alexia Freestoneatty. CSW introduce self and purpose of call. [facility] CSW explained the hospice referral process. CSW Metallurgistemailed Patty additional hospice agencies. CSW will meet with Patty when she arrives to finalize placement. CS[facility].   Insurance information:  Managed Care PT Recommendations:  Not assessed at this time Information / Referral to community resources:  Other (Comment Required) (Hospice)  Patient/Family's Response to care:  Patty acknowledged the staff has treatment the pt well.   Patient/Family's Understanding of and Emotional Response to Diagnosis, Current Treatment, and Prognosis: Patty shared that she has come to terms with the pt's prognosis and does not want the pt to suffer.    Emotional Assessment Appearance:  Unable to Assess  Attitude/Demeanor/Rapport:  Unable to Assess Affect (typically observed):  Unable to Assess Orientation:  Fluctuating Orientation (Suspected and/or reported Sundowners) Psych involvement (Current and /or in the community):  No (Comment)  Discharge Needs   Concerns to be addressed:  Discharge Planning Concerns Readmission within the last 30 days:  Yes Current discharge risk:  None Barriers to Discharge:  No Barriers Identified   Judith Campillo, MSW, LCSWA [phone]

## 2015-05-01 NOTE — Discharge Summary (Signed)
Physician Discharge Summary   Patient ID: Douglas May MRN: [id number] DOB/AGE: [id number] 79 y.o.  Admit date: 04/28/2015 Discharge date: 05/01/2015  Primary Care Physician:  Lupe Carneyean Mitchell, MD  Discharge Diagnoses:    . Dementia with behavioral disturbance . Essential hypertension . delirium with acute encephalopathy   Failure to thrive   Diabetes mellitus   Hypertension  Consults:  Palliative medicine, Dr. Greig RightLampkin   Recommendations for Outpatient Follow-up:  DNR, DNI, comfort care   Patient is being discharged to residential hospice, [facility]   TESTS THAT NEED FOLLOW-UP None    DIET: comfort food     Allergies:   Allergies  Allergen Reactions  . Ace Inhibitors Other (See Comments)  . Demerol [Meperidine] Other (See Comments)  . Morphine And Related Itching  . Penicillins Other (See Comments)    unknown  . Tetanus Toxoids Other (See Comments)     Discharge Medications:   Medication List    STOP taking these medications        acetaminophen 325 MG tablet  Commonly known as:  TYLENOL     alum & mag hydroxide-simeth 200-200-20 MG/5ML suspension  Commonly known as:  MAALOX/MYLANTA     aspirin 81 MG tablet     atorvastatin 40 MG tablet  Commonly known as:  LIPITOR     beta carotene w/minerals tablet     CALCIUM-VITAMIN D PO     cetirizine 10 MG tablet  Commonly known as:  ZYRTEC     clonazePAM 0.5 MG tablet  Commonly known as:  KLONOPIN     docusate sodium 100 MG capsule  Commonly known as:  COLACE     feeding supplement (ENSURE ENLIVE) Liqd     ferrous sulfate 325 (65 FE) MG tablet     insulin aspart 100 UNIT/ML injection  Commonly known as:  novoLOG     metoprolol succinate 50 MG 24 hr tablet  Commonly known as:  TOPROL-XL     multivitamin with minerals Tabs tablet     nitroGLYCERIN 0.4 MG SL tablet  Commonly known as:  NITROSTAT      TAKE these medications        atropine 1 % ophthalmic solution  Place 2 drops  under the tongue every 4 (four) hours as needed (secretions).     LORazepam 0.5 MG tablet  Commonly known as:  ATIVAN  Take 1 tablet (0.5 mg total) by mouth 2 (two) times daily.     QUEtiapine 25 MG tablet  Commonly known as:  SEROQUEL  Take 1 tablet (25 mg total) by mouth 3 (three) times daily.     traMADol 50 MG tablet  Commonly known as:  ULTRAM  Take 1 tablet (50 mg total) by mouth every 6 (six) hours as needed.         Brief H and P: For complete details please refer to admission H and P, but in brief Patient is a 81 year old male with HTN, CAD, DM, h/o CVA, advanced dementia recently admitted to the hospital secondary to acute renal failure and rib fractures and was eventually discharged to a rehabilitation was brought to the ER the patient had a fall at the rehabilitation. Patient was progressively declined in health, confused.  Hospital Course:   Advanced dementia with behavioral changes, encephalopathy and delirium UA negative for UTI. MRI of the brain showed no acute intracranial infarct or other abnormality identified, remote left occipital lobe infarct with remote lacunar infarcts involving bilateral basal ganglia, advanced cerebral  atrophy. Overall poor prognosis, with failure to thrive, having falls, advanced dementia and recurrent CVA. Palliative medicine was consulted. Dr Greig Right discussed in detail with the family and requested hospice care, currently awaiting facility   Diabetes mellitus type 2, controlled - Currently comfort care.   Acute encephalopathy with delirium, advanced dementia Continue Ativan, Seroquel, as needed Haldol  Mild acute renal insufficiency Improved, creatinine 1.215/27  Elevated troponin Point of care 1.33, no chest pain, patient is comfort care, no further cardiac workup was initiated  Patient is accepted to residential hospice.  Day of Discharge BP 130/66 mmHg  Pulse 72  Temp(Src) 98 F (36.7 C) (Axillary)  Resp 18  Ht   (1.575 m)  Wt 63.594 kg (140 lb 3.2 oz)  BMI 25.64 kg/m2  SpO2 95%  Physical Exam:   General: confused, not following commands,  HEENT: PERRLA, EOMI  Neck: Supple, no JVD, no masses  CVS: S1 S2 auscultated, no rubs, murmurs or gallops. Regular rate and rhythm.  Respiratory: Clear to auscultation bilaterally, no wheezing, rales or rhonchi  Abdomen: Soft, nontender, nondistended, + bowel sounds  Ext: no cyanosis clubbing or edema  Neuro: unable to assess, confused  Skin: No rashes  Psych: Confused, not following verbal commands  The results of significant diagnostics from this hospitalization (including imaging, microbiology, ancillary and laboratory) are listed below for reference.    LAB RESULTS: Basic Metabolic Panel:  Recent Labs Lab 04/28/15 2204  NA 143  K 3.8  CL 105  GLUCOSE 191*  BUN 18  CREATININE 1.20   Liver Function Tests: No results for input(s): AST, ALT, ALKPHOS, BILITOT, PROT, ALBUMIN in the last 168 hours. No results for input(s): LIPASE, AMYLASE in the last 168 hours. No results for input(s): AMMONIA in the last 168 hours. CBC:  Recent Labs Lab 04/28/15 2157 04/28/15 2204  WBC 11.5*  --   HGB 11.8* 12.6*  HCT 36.5* 37.0*  MCV 96.6  --   PLT 314  --    Cardiac Enzymes: No results for input(s): CKTOTAL, CKMB, CKMBINDEX, TROPONINI in the last 168 hours. BNP: Invalid input(s): POCBNP CBG:  Recent Labs Lab 04/29/15 0737 04/29/15 1211  GLUCAP 147* 103*    Significant Diagnostic Studies:  Ct Head Wo Contrast  04/28/2015   CLINICAL DATA:  Status post fall out of wheelchair to floor, and hit head. Right-sided scalp hematoma. Concern for cervical spine injury. Initial encounter.  EXAM: CT HEAD WITHOUT CONTRAST  CT CERVICAL SPINE WITHOUT CONTRAST  TECHNIQUE: Multidetector CT imaging of the head and cervical spine was performed following the standard protocol without intravenous contrast. Multiplanar CT image reconstructions of the  cervical spine were also generated.  COMPARISON:  CT of the head performed 04/12/2015, and MRI/MRA of the brain from 09/13/2012  FINDINGS: CT HEAD FINDINGS  There is no evidence of acute infarction, mass lesion, or intra- or extra-axial hemorrhage on CT.  Prominence of the ventricles and sulci reflects moderate cortical volume loss. A chronic infarct is noted at the left occipital lobe, with associated encephalomalacia. Scattered periventricular and subcortical white matter change likely reflects small vessel ischemic microangiopathy. A chronic lacunar infarct is noted at the left thalamus. Mild cerebellar atrophy is noted.  The brainstem and fourth ventricle are within normal limits. No mass effect or midline shift is seen.  There is no evidence of fracture; visualized osseous structures are unremarkable in appearance. The orbits are within normal limits. The paranasal sinuses and mastoid air cells are well-aerated. Mild soft tissue  swelling is noted overlying the right frontal calvarium.  CT CERVICAL SPINE FINDINGS  There is no evidence of acute fracture or subluxation. There is grade 1 retrolisthesis of C2 on C3, and grade 1 anterolisthesis of C4 on C5. There is grade 1 retrolisthesis of C5 on C6, and minimal grade 1 anterolisthesis of C6 on C7. Underlying facet disease is noted. Multilevel disc space narrowing and endplate irregularity are seen along the cervical spine. Vertebral bodies demonstrate normal height. Prevertebral soft tissues are within normal limits.  Minimal calcification is noted at the right thyroid lobe, without a dominant mass. Scattered peripheral blebs are seen at the lung apices. Scattered calcification is seen at the carotid bifurcations bilaterally.  IMPRESSION: 1. No evidence of traumatic intracranial injury or fracture. 2. No evidence of acute fracture or subluxation along the cervical spine. 3. Mild soft tissue swelling noted overlying the right frontal calvarium. 4. Moderate cortical  volume loss and scattered small vessel ischemic microangiopathy noted. 5. Chronic infarct at the left occipital lobe, with associated encephalomalacia. Chronic lacunar infarct at the left thalamus. 6. Mild diffuse degenerative change along the cervical spine. 7. Scattered peripheral blebs noted at the lung apices. 8. Scattered calcification at the carotid bifurcations bilaterally. Carotid ultrasound could be considered for further evaluation, when and as deemed clinically appropriate.   Electronically Signed   By: Roanna Raider M.D.   On: 04/28/2015 23:28   Ct Cervical Spine Wo Contrast  04/28/2015   CLINICAL DATA:  Status post fall out of wheelchair to floor, and hit head. Right-sided scalp hematoma. Concern for cervical spine injury. Initial encounter.  EXAM: CT HEAD WITHOUT CONTRAST  CT CERVICAL SPINE WITHOUT CONTRAST  TECHNIQUE: Multidetector CT imaging of the head and cervical spine was performed following the standard protocol without intravenous contrast. Multiplanar CT image reconstructions of the cervical spine were also generated.  COMPARISON:  CT of the head performed 04/12/2015, and MRI/MRA of the brain from 09/13/2012  FINDINGS: CT HEAD FINDINGS  There is no evidence of acute infarction, mass lesion, or intra- or extra-axial hemorrhage on CT.  Prominence of the ventricles and sulci reflects moderate cortical volume loss. A chronic infarct is noted at the left occipital lobe, with associated encephalomalacia. Scattered periventricular and subcortical white matter change likely reflects small vessel ischemic microangiopathy. A chronic lacunar infarct is noted at the left thalamus. Mild cerebellar atrophy is noted.  The brainstem and fourth ventricle are within normal limits. No mass effect or midline shift is seen.  There is no evidence of fracture; visualized osseous structures are unremarkable in appearance. The orbits are within normal limits. The paranasal sinuses and mastoid air cells are  well-aerated. Mild soft tissue swelling is noted overlying the right frontal calvarium.  CT CERVICAL SPINE FINDINGS  There is no evidence of acute fracture or subluxation. There is grade 1 retrolisthesis of C2 on C3, and grade 1 anterolisthesis of C4 on C5. There is grade 1 retrolisthesis of C5 on C6, and minimal grade 1 anterolisthesis of C6 on C7. Underlying facet disease is noted. Multilevel disc space narrowing and endplate irregularity are seen along the cervical spine. Vertebral bodies demonstrate normal height. Prevertebral soft tissues are within normal limits.  Minimal calcification is noted at the right thyroid lobe, without a dominant mass. Scattered peripheral blebs are seen at the lung apices. Scattered calcification is seen at the carotid bifurcations bilaterally.  IMPRESSION: 1. No evidence of traumatic intracranial injury or fracture. 2. No evidence of acute fracture or subluxation  along the cervical spine. 3. Mild soft tissue swelling noted overlying the right frontal calvarium. 4. Moderate cortical volume loss and scattered small vessel ischemic microangiopathy noted. 5. Chronic infarct at the left occipital lobe, with associated encephalomalacia. Chronic lacunar infarct at the left thalamus. 6. Mild diffuse degenerative change along the cervical spine. 7. Scattered peripheral blebs noted at the lung apices. 8. Scattered calcification at the carotid bifurcations bilaterally. Carotid ultrasound could be considered for further evaluation, when and as deemed clinically appropriate.   Electronically Signed   By: Roanna Raider M.D.   On: 04/28/2015 23:28   Mr Brain Wo Contrast  04/29/2015   CLINICAL DATA:  Initial evaluation for acute fall, head injury.  EXAM: MRI HEAD WITHOUT CONTRAST  TECHNIQUE: Multiplanar, multiecho pulse sequences of the brain and surrounding structures were obtained without intravenous contrast.  COMPARISON:  Prior CT from 04/28/2015  FINDINGS: Diffuse prominence of the CSF  containing spaces is compatible with generalized cerebral atrophy. Patchy and confluent T2/FLAIR hyperintensity involving the periventricular deep white matter both cerebral hemispheres most consistent with chronic small vessel ischemic changes, fairly advanced. Encephalomalacia within the left occipital lobe compatible with remote infarct. Additional scattered remote lacunar infarcts present within the bilateral basal ganglia.  No abnormal foci of restricted diffusion to suggest acute intracranial infarct. Gray-white matter differentiation maintained. Normal intravascular flow voids preserved. No acute intracranial hemorrhage. Chronic blood products seen associated with remote left occipital infarct.  No mass lesion or midline shift. No mass effect. No hydrocephalus. No extra-axial fluid collection.  Craniocervical junction within normal limits. Degenerative changes noted within the upper cervical spine.  Pituitary gland within normal limits. No acute abnormality about the orbits. Paranasal sinuses and mastoid air cells are clear.  Small right forehead scalp contusion. Bone marrow signal intensity normal.  IMPRESSION: 1. No acute intracranial infarct or other abnormality identified. 2. Small right frontal scalp contusion. 3. Remote left occipital lobe infarct with additional remote lacunar infarcts involving the bilateral basal ganglia. 4. Advanced cerebral atrophy with chronic microvascular ischemic disease.   Electronically Signed   By: Rise Mu M.D.   On: 04/29/2015 01:38   Dg Chest Port 1 View  04/28/2015   CLINICAL DATA:  Fall with head injury.  Altered mental status  EXAM: PORTABLE CHEST - 1 VIEW  COMPARISON:  04/12/2015  FINDINGS: No cardiomegaly. There is a chronic lower mediastinal mass which reflects large hiatal hernia based on previously seen fluid level 12/22/2014. There is no edema, consolidation, effusion, or pneumothorax. Remote median sternotomy correlating with history of CABG.  Changes correlating with history of right carotid endarterectomy.  Remote left rib fractures. Remote left humeral diaphysis fracture with failed/fractured rods and overriding. No acute osseous findings.  Cholelithiasis.  IMPRESSION: 1. No active disease. 2. Multiple incidental findings are stable from prior and noted above.   Electronically Signed   By: Marnee Spring M.D.   On: 04/28/2015 23:44    2D ECHO:   Disposition and Follow-up:    DISPOSITION:  Residential hospice   DISCHARGE FOLLOW-UP     Follow-up Information    Follow up with Lupe Carney, MD. Schedule an appointment as soon as possible for a visit in 1 week.   Specialty:  Family Medicine   Why:  for hospital follow-up, As needed, If symptoms worsen   Contact information:   [address] [place] [address] [phone]        Time spent on Discharge: 25 mins   Signed:  RAI,RIPUDEEP M.D. [facility] 05/01/2015, 11:26 AM Pager: [phone]

## 2015-05-01 NOTE — Clinical Social Work Note (Signed)
CSW spoke with Patty.  CSW informed the [facility] that the pt will be discharge today. CSW and Patty discussed ambulance transport. CSW contact [facility] at [phone] to schedule transport for the pt. Bedside RN provided number to call report.   Nabeeha Badertscher, MSW, LCSWA [phone]

## 2015-05-01 NOTE — Progress Notes (Signed)
Patient is discharged from room 4n17 at this time. Alert and in stable condition. Report called to nurse Bonna Gainsarol Stolwyk, [facility]. IV site d/c'd. Left unit via stretcher by [facility] with children and belongings at side.

## 2015-05-19 ENCOUNTER — Ambulatory Visit: Payer: Medicare PPO | Admitting: Interventional Cardiology

## 2015-06-02 DEATH — deceased

## 2016-02-19 IMAGING — CT CT HEAD W/O CM
1 series · 16 of 30 positions shown, 20 images · non-contrast
Comparison: 09/12/2012

CLINICAL DATA: Altered mental status.  Gait problem.

EXAM:
CT HEAD WITHOUT CONTRAST
TECHNIQUE: Contiguous axial images were obtained from the base of the skull
through the vertex without intravenous contrast.

[Series 2: head 5.0 h30s · axial · 0.46mm/px · z∈[-164,-4]mm · 16 of 36 slices shown, 20 images]
[im 2/36  brain]
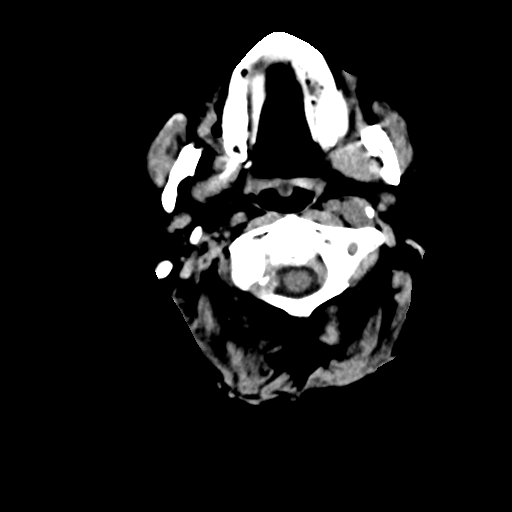
[im 2/36  bone]
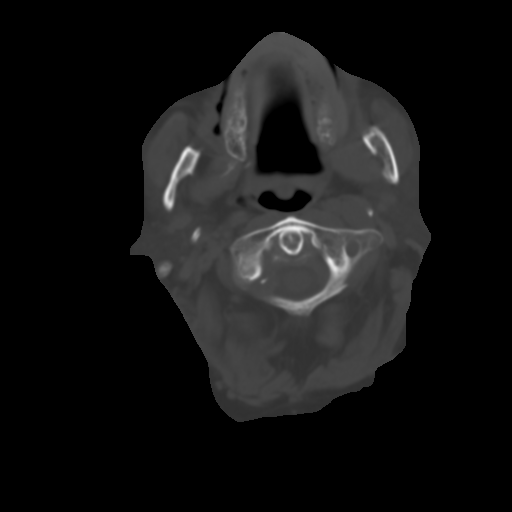
[im 4/36  brain]
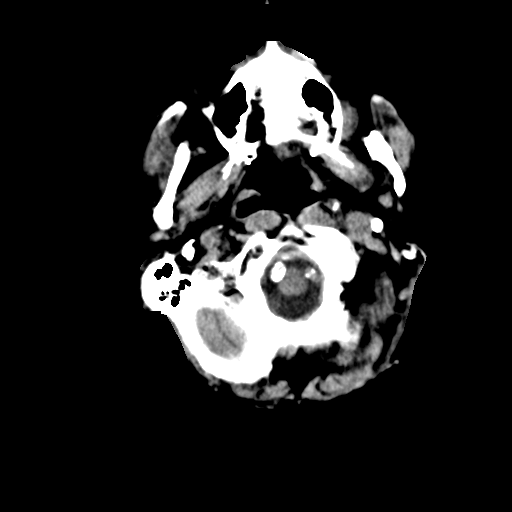
[im 7/36  brain]
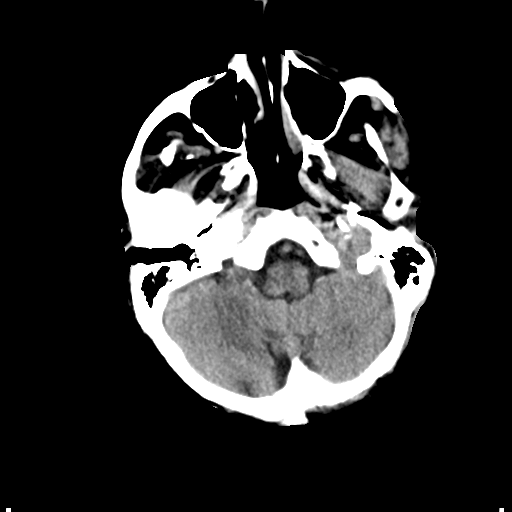
[im 9/36  brain]
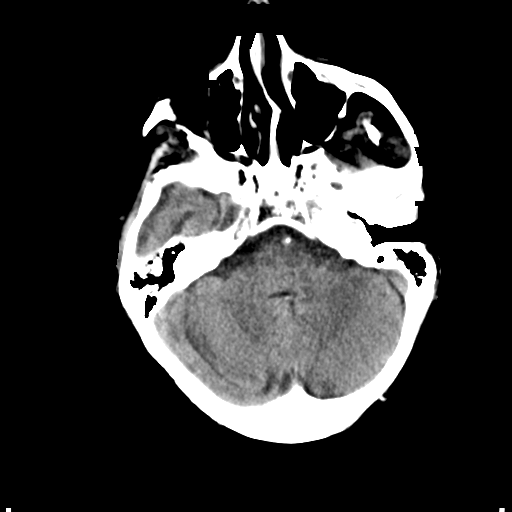
[im 10/36  brain]
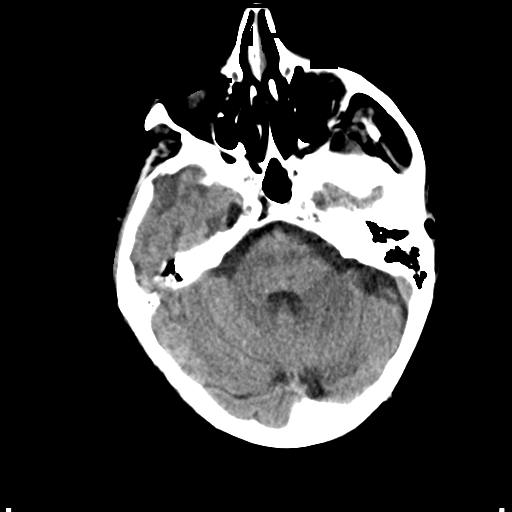
[im 10/36  bone]
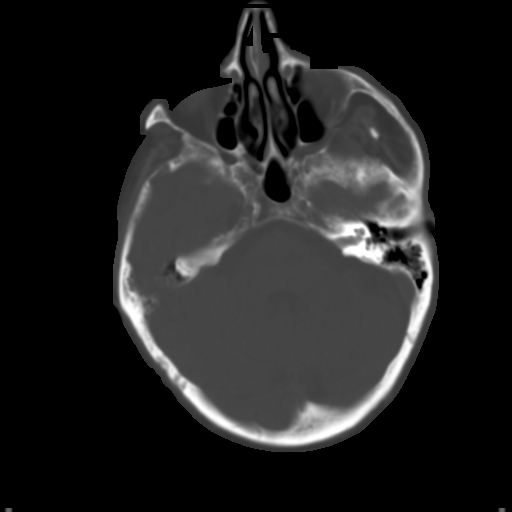
[im 13/36  brain]
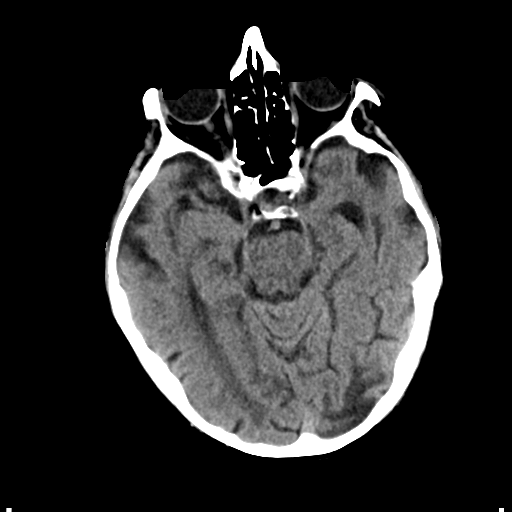
[im 15/36  brain]
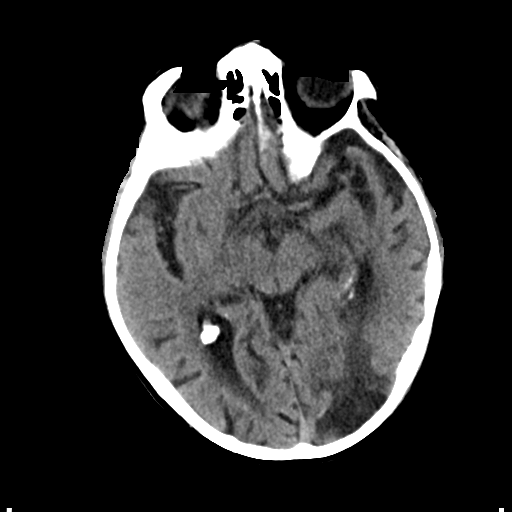
[im 17/36  brain]
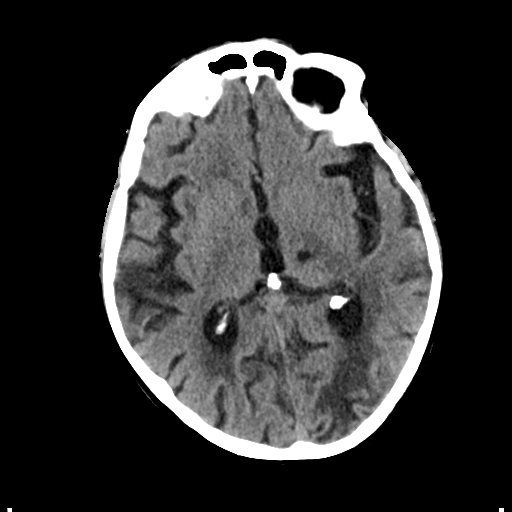
[im 19/36  brain]
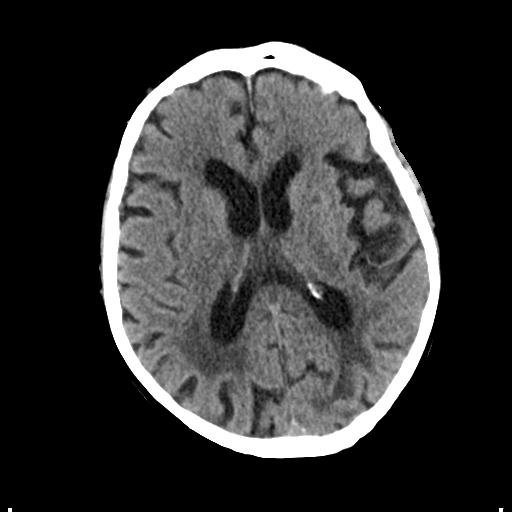
[im 19/36  bone]
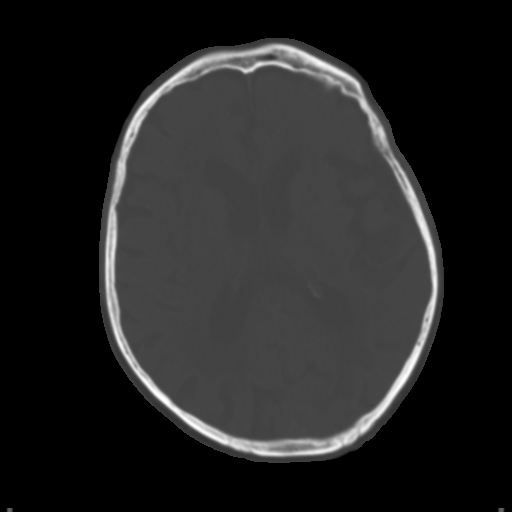
[im 21/36  brain]
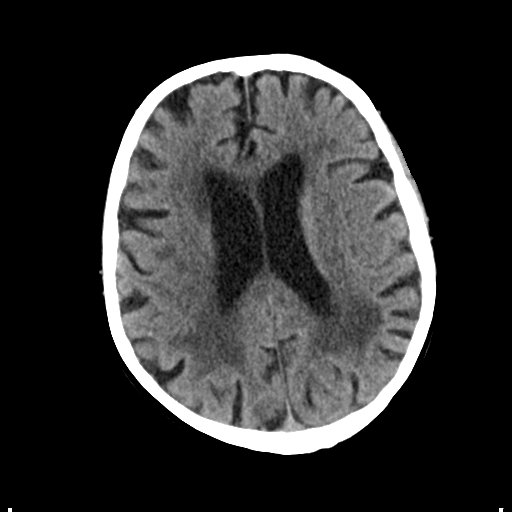
[im 23/36  brain]
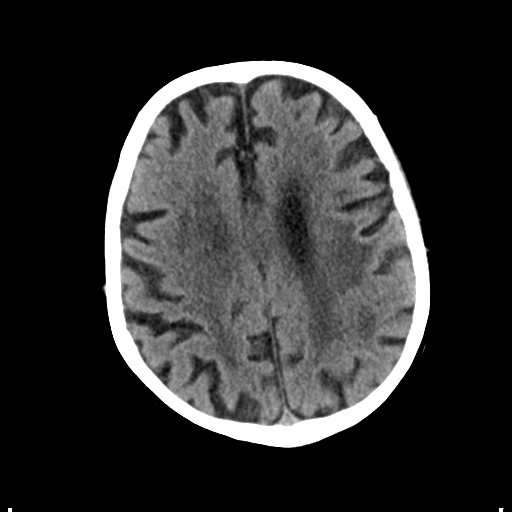
[im 26/36  brain]
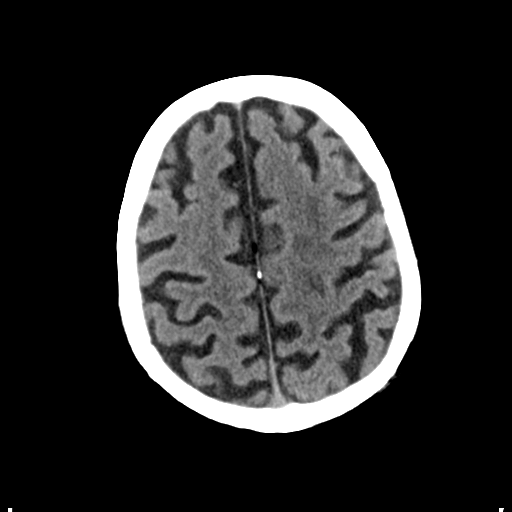
[im 27/36  brain]
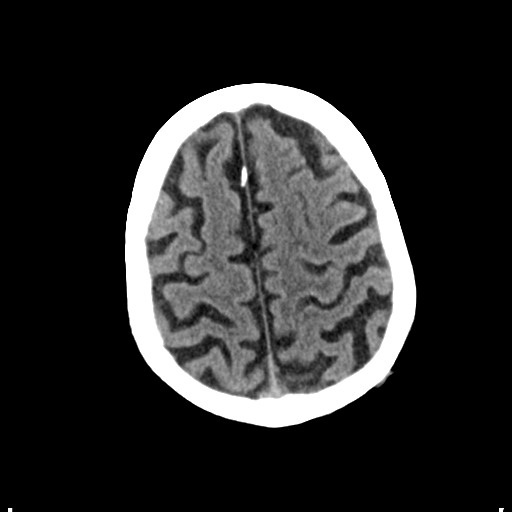
[im 27/36  bone]
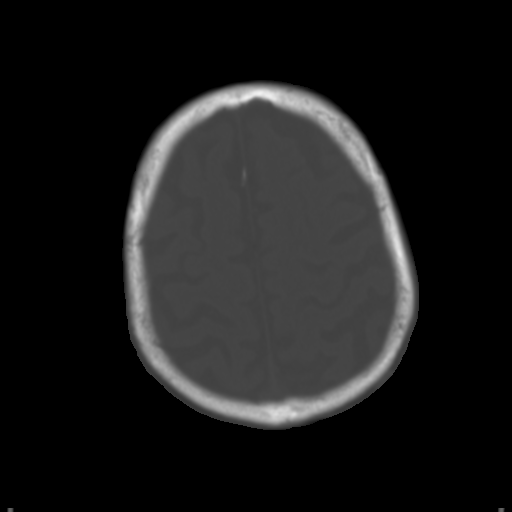
[im 29/36  brain]
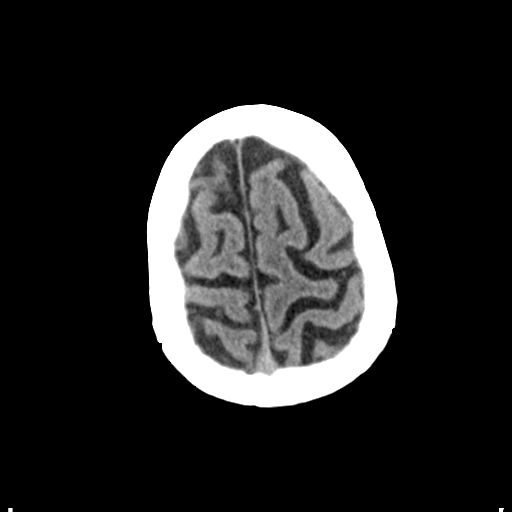
[im 32/36  brain]
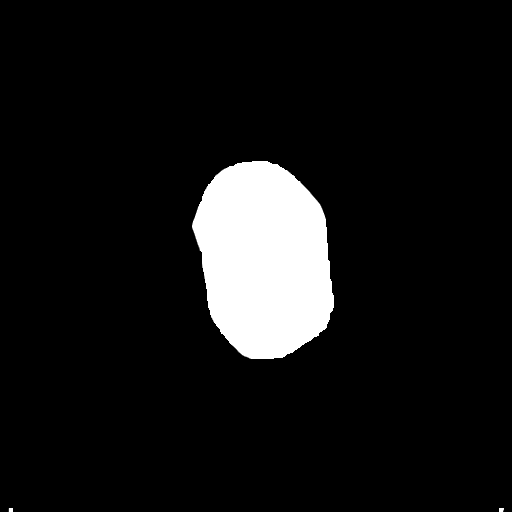
[im 34/36  brain]
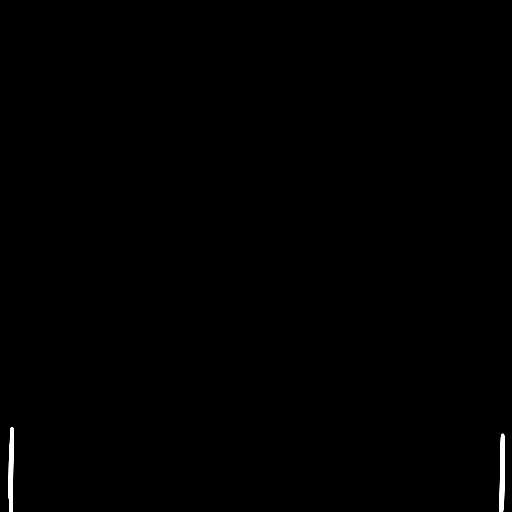

[16 of 30 positions shown; findings below may reference images not displayed]

FINDINGS: Encephalomalacia within the left occipital lobe is again noted
compatible with previous infarct. There is diffuse low attenuation
throughout the subcortical and periventricular white matter
compatible with chronic microvascular disease. Old left alignment
lacunar infarct is again noted and appears unchanged. Chronic
bilateral lacunar infarcts within the basal ganglia are noted. No
evidence for acute cortical stroke, intracranial hemorrhage or mass.
Prominence of the sulci and ventricles noted consistent with brain
atrophy. The mastoid air cells and the paranasal sinuses are clear.
The calvarium is intact.
IMPRESSION: 1. Small vessel ischemic disease and brain atrophy.
2. Left occipital lobe encephalomalacia compatible with old infarct.
3. No acute intracranial abnormalities.

## 2016-03-06 IMAGING — CT CT CERVICAL SPINE W/O CM
3 of 8 series · 11 of 33 positions shown, 13 images · non-contrast
Comparison: CT of the head performed 04/12/2015, and MRI/MRA of the
brain from 09/13/2012

CLINICAL DATA: Status post fall out of wheelchair to floor, and hit
head. Right-sided scalp hematoma. Concern for cervical spine injury.
Initial encounter.

EXAM:
CT HEAD WITHOUT CONTRAST
CT CERVICAL SPINE WITHOUT CONTRAST
TECHNIQUE: Multidetector CT imaging of the head and cervical spine was
performed following the standard protocol without intravenous
contrast. Multiplanar CT image reconstructions of the cervical spine
were also generated.

[Series 205: straightened bone · axial · 0.49mm/px · z∈[+234,+384]mm · 3 of 61 slices shown, 4 images]
[im 1/61  soft-tissue]
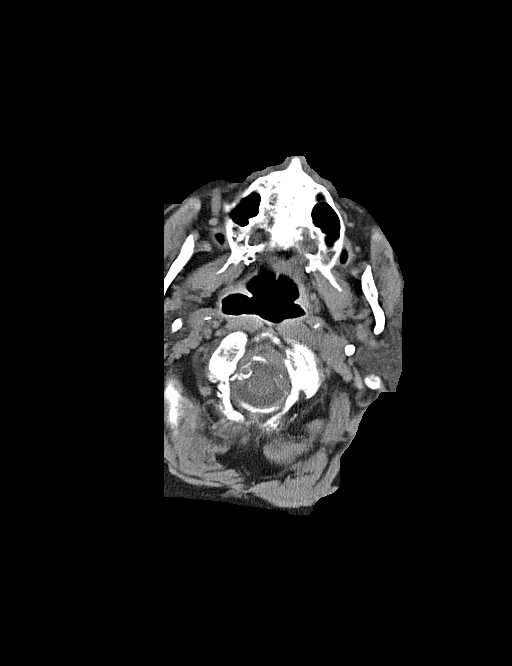
[im 1/61  bone]
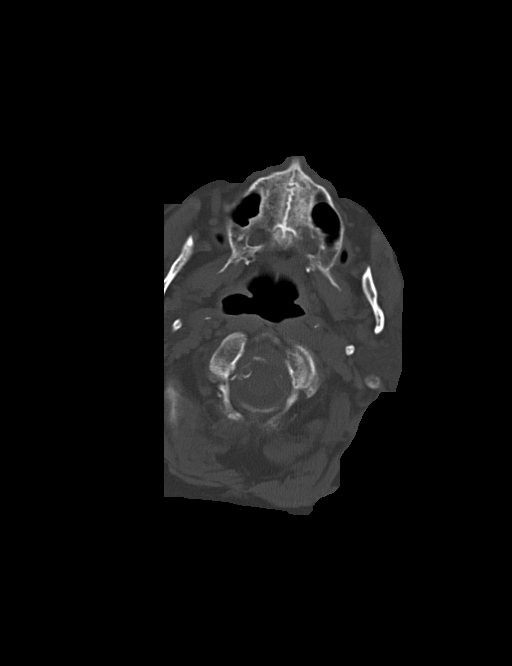
[im 31/61  bone]
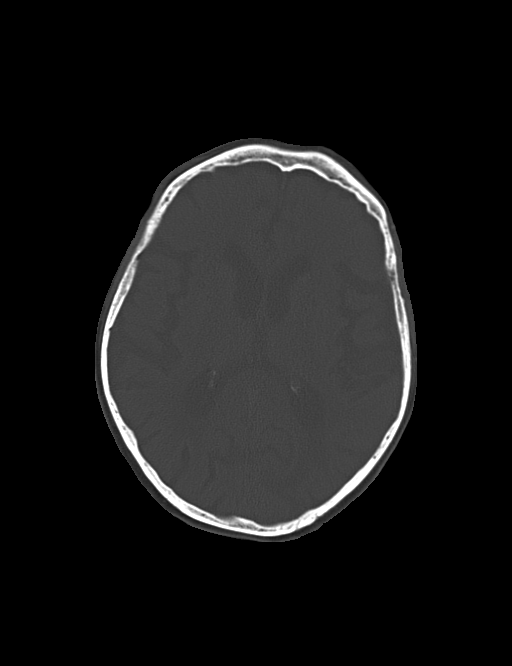
[im 61/61  bone]
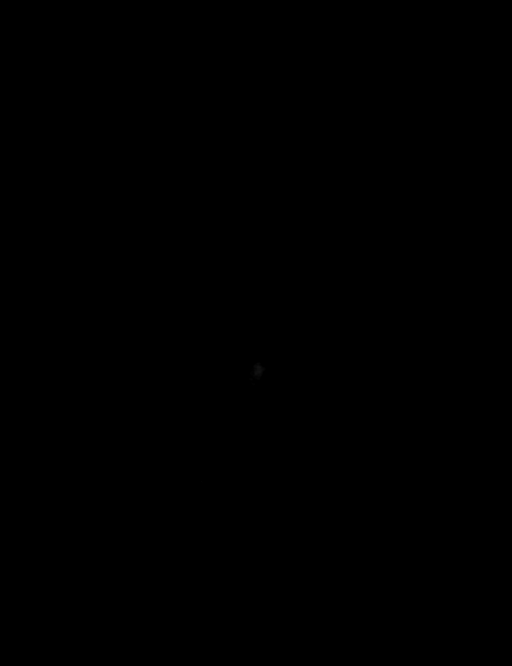

[Series 308: upper coronals · coronal · 0.55mm/px · 3 of 52 slices shown]
[im 12/52  bone]
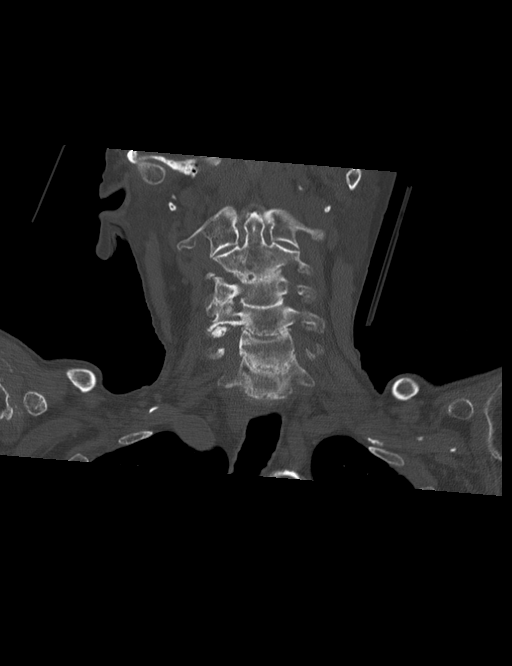
[im 21/52  bone]
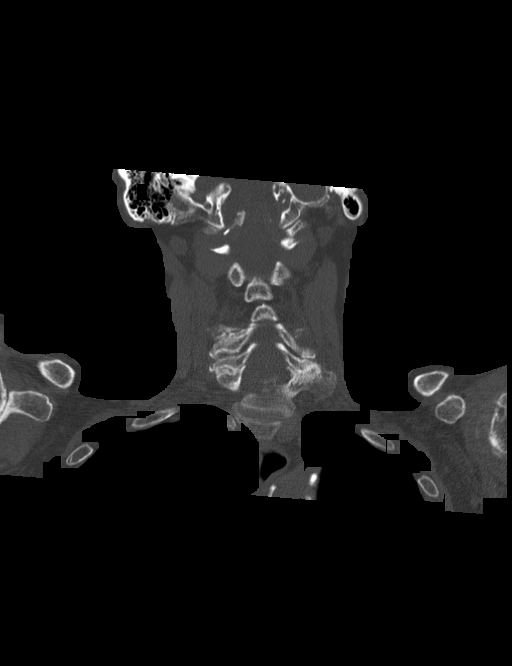
[im 31/52  bone]
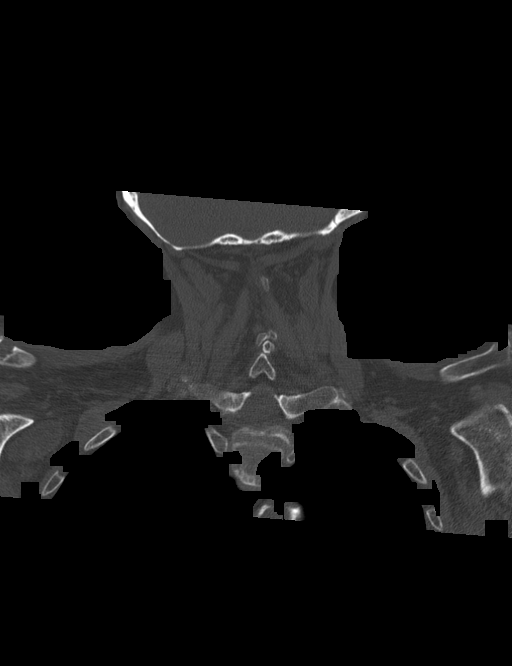

[Series 3010: sagittals · sagittal · 0.50mm/px · 5 of 51 slices shown, 6 images]
[im 17/51  bone]
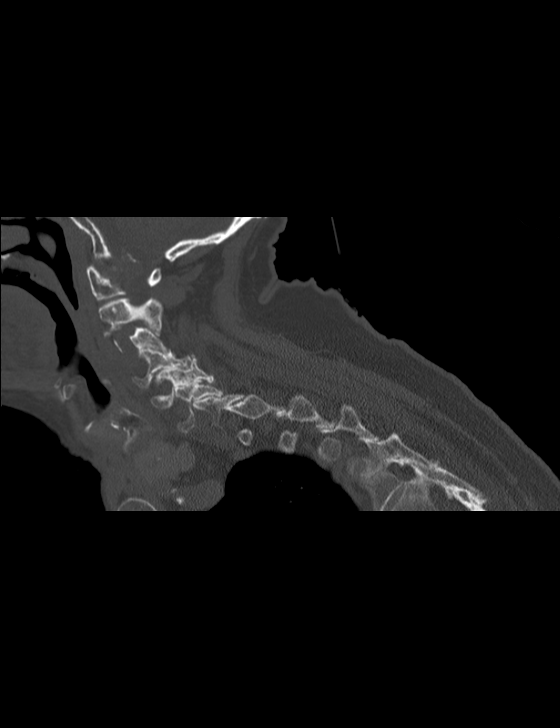
[im 21/51  bone]
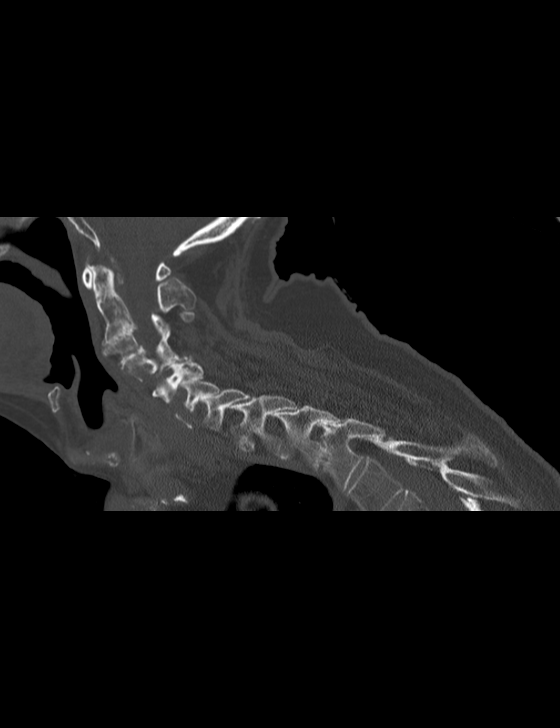
[im 26/51  soft-tissue]
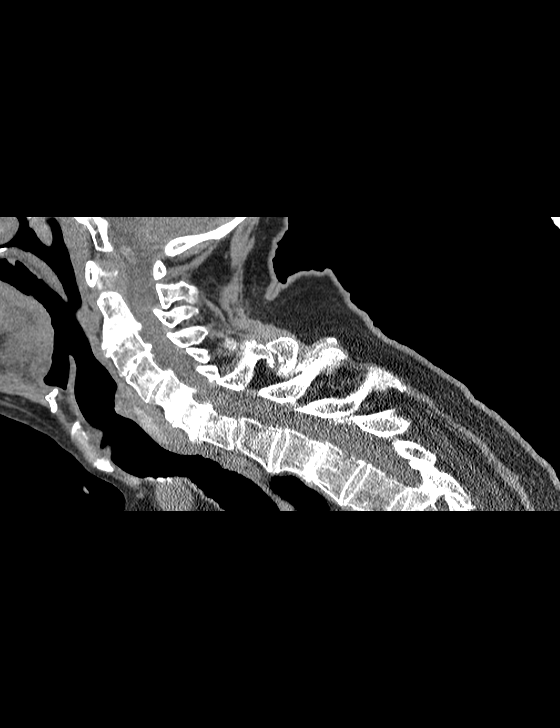
[im 26/51  bone]
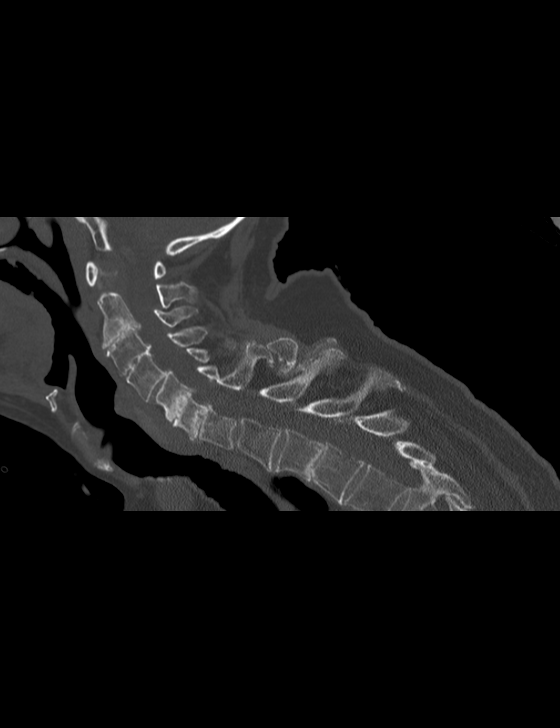
[im 30/51  bone]
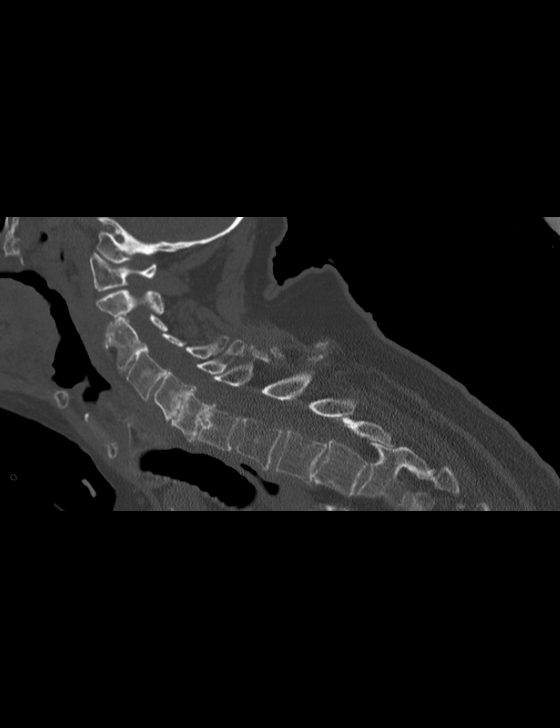
[im 34/51  bone]
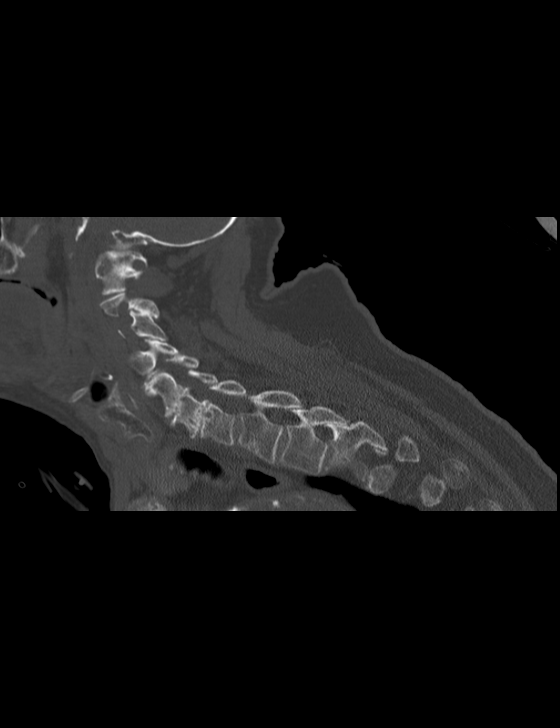

[11 of 33 positions shown; findings below may reference images not displayed]

FINDINGS: CT HEAD FINDINGS

There is no evidence of acute infarction, mass lesion, or intra- or
extra-axial hemorrhage on CT.

Prominence of the ventricles and sulci reflects moderate cortical
volume loss. A chronic infarct is noted at the left occipital lobe,
with associated encephalomalacia. Scattered periventricular and
subcortical white matter change likely reflects small vessel
ischemic microangiopathy. A chronic lacunar infarct is noted at the
left thalamus. Mild cerebellar atrophy is noted.

The brainstem and fourth ventricle are within normal limits. No mass
effect or midline shift is seen.

There is no evidence of fracture; visualized osseous structures are
unremarkable in appearance. The orbits are within normal limits. The
paranasal sinuses and mastoid air cells are well-aerated. Mild soft
tissue swelling is noted overlying the right frontal calvarium.

CT CERVICAL SPINE FINDINGS

There is no evidence of acute fracture or subluxation. There is
grade 1 retrolisthesis of C2 on C3, and grade 1 anterolisthesis of
C4 on C5. There is grade 1 retrolisthesis of C5 on C6, and minimal
grade 1 anterolisthesis of C6 on C7. Underlying facet disease is
noted. Multilevel disc space narrowing and endplate irregularity are
seen along the cervical spine. Vertebral bodies demonstrate normal
height. Prevertebral soft tissues are within normal limits.

Minimal calcification is noted at the right thyroid lobe, without a
dominant mass. Scattered peripheral blebs are seen at the lung
apices. Scattered calcification is seen at the carotid bifurcations
bilaterally.
IMPRESSION: 1. No evidence of traumatic intracranial injury or fracture.
2. No evidence of acute fracture or subluxation along the cervical
spine.
3. Mild soft tissue swelling noted overlying the right frontal
calvarium.
4. Moderate cortical volume loss and scattered small vessel ischemic
microangiopathy noted.
5. Chronic infarct at the left occipital lobe, with associated
encephalomalacia. Chronic lacunar infarct at the left thalamus.
6. Mild diffuse degenerative change along the cervical spine.
7. Scattered peripheral blebs noted at the lung apices.
8. Scattered calcification at the carotid bifurcations bilaterally.
Carotid ultrasound could be considered for further evaluation, when
and as deemed clinically appropriate.

## 2021-01-15 ENCOUNTER — Encounter (INDEPENDENT_AMBULATORY_CARE_PROVIDER_SITE_OTHER): Payer: Self-pay
